# Patient Record
Sex: Female | Born: 1995 | Race: Black or African American | Hispanic: No | Marital: Single | Smoking: Former smoker
Health system: Southern US, Community
[De-identification: ages and names within clinical notes are randomized; demographics above are authoritative.]

## PROBLEM LIST (undated history)

## (undated) DIAGNOSIS — J45909 Unspecified asthma, uncomplicated: Secondary | ICD-10-CM

## (undated) DIAGNOSIS — F419 Anxiety disorder, unspecified: Secondary | ICD-10-CM

## (undated) DIAGNOSIS — F329 Major depressive disorder, single episode, unspecified: Secondary | ICD-10-CM

## (undated) DIAGNOSIS — F32A Depression, unspecified: Secondary | ICD-10-CM

## (undated) HISTORY — PX: TONSILLECTOMY: SUR1361

---

## 2017-05-23 ENCOUNTER — Emergency Department (HOSPITAL_BASED_OUTPATIENT_CLINIC_OR_DEPARTMENT_OTHER)
Admission: EM | Admit: 2017-05-23 | Discharge: 2017-05-23 | Disposition: A | Discharge status: Home / Self Care | Payer: Self-pay | Attending: Emergency Medicine | Admitting: Emergency Medicine

## 2017-05-23 ENCOUNTER — Other Ambulatory Visit: Payer: Self-pay

## 2017-05-23 ENCOUNTER — Emergency Department (HOSPITAL_BASED_OUTPATIENT_CLINIC_OR_DEPARTMENT_OTHER): Payer: Self-pay

## 2017-05-23 ENCOUNTER — Encounter (HOSPITAL_BASED_OUTPATIENT_CLINIC_OR_DEPARTMENT_OTHER): Payer: Self-pay

## 2017-05-23 DIAGNOSIS — R509 Fever, unspecified: Secondary | ICD-10-CM | POA: Insufficient documentation

## 2017-05-23 DIAGNOSIS — R1032 Left lower quadrant pain: Secondary | ICD-10-CM | POA: Insufficient documentation

## 2017-05-23 DIAGNOSIS — R112 Nausea with vomiting, unspecified: Secondary | ICD-10-CM | POA: Insufficient documentation

## 2017-05-23 DIAGNOSIS — J45909 Unspecified asthma, uncomplicated: Secondary | ICD-10-CM | POA: Insufficient documentation

## 2017-05-23 HISTORY — DX: Unspecified asthma, uncomplicated: J45.909

## 2017-05-23 LAB — COMPREHENSIVE METABOLIC PANEL
ALK PHOS: 51 U/L (ref 38–126)
ALT: 16 U/L (ref 14–54)
ANION GAP: 10 (ref 5–15)
AST: 24 U/L (ref 15–41)
Albumin: 3.9 g/dL (ref 3.5–5.0)
BILIRUBIN TOTAL: 0.8 mg/dL (ref 0.3–1.2)
BUN: 7 mg/dL (ref 6–20)
CALCIUM: 9 mg/dL (ref 8.9–10.3)
CO2: 22 mmol/L (ref 22–32)
CREATININE: 0.7 mg/dL (ref 0.44–1.00)
Chloride: 98 mmol/L — ABNORMAL LOW (ref 101–111)
Glucose, Bld: 106 mg/dL — ABNORMAL HIGH (ref 65–99)
Potassium: 3.8 mmol/L (ref 3.5–5.1)
Sodium: 130 mmol/L — ABNORMAL LOW (ref 135–145)
TOTAL PROTEIN: 7.7 g/dL (ref 6.5–8.1)

## 2017-05-23 LAB — CBC WITH DIFFERENTIAL/PLATELET
BASOS ABS: 0 10*3/uL (ref 0.0–0.1)
BASOS PCT: 0 %
EOS ABS: 0.1 10*3/uL (ref 0.0–0.7)
EOS PCT: 1 %
HCT: 43.8 % (ref 36.0–46.0)
Hemoglobin: 15.2 g/dL — ABNORMAL HIGH (ref 12.0–15.0)
LYMPHS ABS: 1.4 10*3/uL (ref 0.7–4.0)
Lymphocytes Relative: 13 %
MCH: 29.9 pg (ref 26.0–34.0)
MCHC: 34.7 g/dL (ref 30.0–36.0)
MCV: 86.2 fL (ref 78.0–100.0)
Monocytes Absolute: 1.1 10*3/uL — ABNORMAL HIGH (ref 0.1–1.0)
Monocytes Relative: 10 %
Neutro Abs: 8 10*3/uL — ABNORMAL HIGH (ref 1.7–7.7)
Neutrophils Relative %: 76 %
PLATELETS: 264 10*3/uL (ref 150–400)
RBC: 5.08 MIL/uL (ref 3.87–5.11)
RDW: 13.2 % (ref 11.5–15.5)
WBC: 10.6 10*3/uL — AB (ref 4.0–10.5)

## 2017-05-23 LAB — URINALYSIS, ROUTINE W REFLEX MICROSCOPIC
BILIRUBIN URINE: NEGATIVE
Glucose, UA: NEGATIVE mg/dL
KETONES UR: 15 mg/dL — AB
Nitrite: NEGATIVE
PH: 7.5 (ref 5.0–8.0)
Protein, ur: 30 mg/dL — AB
SPECIFIC GRAVITY, URINE: 1.015 (ref 1.005–1.030)

## 2017-05-23 LAB — URINALYSIS, MICROSCOPIC (REFLEX)

## 2017-05-23 LAB — MONONUCLEOSIS SCREEN: Mono Screen: NEGATIVE

## 2017-05-23 LAB — PREGNANCY, URINE: Preg Test, Ur: NEGATIVE

## 2017-05-23 LAB — I-STAT CG4 LACTIC ACID, ED: LACTIC ACID, VENOUS: 0.7 mmol/L (ref 0.5–1.9)

## 2017-05-23 MED ORDER — IBUPROFEN 800 MG PO TABS
800.0000 mg | ORAL_TABLET | Freq: Once | ORAL | Status: AC
  Administered 2017-05-23: 800 mg via ORAL
  Filled 2017-05-23: qty 1

## 2017-05-23 MED ORDER — SODIUM CHLORIDE 0.9 % IV BOLUS (SEPSIS)
1000.0000 mL | Freq: Once | INTRAVENOUS | Status: AC
  Administered 2017-05-23: 1000 mL via INTRAVENOUS

## 2017-05-23 MED ORDER — KETOROLAC TROMETHAMINE 30 MG/ML IJ SOLN
30.0000 mg | Freq: Once | INTRAMUSCULAR | Status: AC
  Administered 2017-05-23: 30 mg via INTRAVENOUS
  Filled 2017-05-23: qty 1

## 2017-05-23 MED ORDER — ONDANSETRON HCL 4 MG/2ML IJ SOLN
4.0000 mg | Freq: Once | INTRAMUSCULAR | Status: AC
  Administered 2017-05-23: 4 mg via INTRAVENOUS
  Filled 2017-05-23: qty 2

## 2017-05-23 MED ORDER — ONDANSETRON 4 MG PO TBDP: ORAL_TABLET | ORAL | 0 refills | Status: AC

## 2017-05-23 NOTE — ED Triage Notes (Signed)
C/o abd pain, fever, n/v x 2 days-NAD-steady gait

## 2017-05-23 NOTE — Discharge Instructions (Signed)
Ibuprofen or tylenol for pain/discomfort. Use zofran as directed for persistent vomiting.

## 2017-05-23 NOTE — ED Provider Notes (Signed)
[facility] EMERGENCY DEPARTMENT Provider Note   CSN: [id number] Arrival date & time: 05/23/17  2021     History   Chief Complaint Chief Complaint  Patient presents with  . Abdominal Pain    HPI Christine Stephens is a 22 y.o. female.  Patient states she developed fever and chills yesterday, with sudden onset on left lateral abdominal pain and nausea and vomiting.   The history is provided by the patient. No language interpreter was used.  Abdominal Pain   This is a new problem. The current episode started yesterday. The pain is located in the LLQ. The quality of the pain is colicky. The pain is moderate. Associated symptoms include fever, nausea and vomiting. Pertinent negatives include dysuria.    Past Medical History:  Diagnosis Date  . Asthma     There are no active problems to display for this patient.   Past Surgical History:  Procedure Laterality Date  . TONSILLECTOMY      OB History    No data available       Home Medications    Prior to Admission medications   Not on File    Family History No family history on file.  Social History Social History   Tobacco Use  . Smoking status: Never Smoker  . Smokeless tobacco: Never Used  Substance Use Topics  . Alcohol use: Yes    Comment: weekly  . Drug use: No     Allergies   Patient has no known allergies.   Review of Systems Review of Systems  Constitutional: Positive for fever.  Gastrointestinal: Positive for abdominal pain, nausea and vomiting.  Genitourinary: Negative for dysuria and vaginal discharge.  All other systems reviewed and are negative.    Physical Exam Updated Vital Signs BP 132/83 (BP Location: Left Arm)   Pulse (!) 133   Temp (!) 102.4 F (39.1 C) (Oral)   Resp 20   Ht 5\' 7"  (1.702 m)   Wt 60.5 kg (133 lb 6.1 oz)   SpO2 98%   BMI 20.89 kg/m   Physical Exam  Constitutional: She appears well-developed and well-nourished.  HENT:  Head: Atraumatic.    Eyes: Conjunctivae are normal.  Neck: Neck supple.  Cardiovascular: Regular rhythm.  Pulmonary/Chest: Effort normal and breath sounds normal.  Abdominal: Bowel sounds are normal. There is tenderness in the left lower quadrant. There is no rigidity, no guarding and no CVA tenderness.  Musculoskeletal: Normal range of motion.  Lymphadenopathy:    She has no cervical adenopathy.  Skin: Skin is warm and dry.  Psychiatric: She has a normal mood and affect.  Nursing note and vitals reviewed.    ED Treatments / Results  Labs (all labs ordered are listed, but only abnormal results are displayed) Labs Reviewed  URINALYSIS, ROUTINE W REFLEX MICROSCOPIC - Abnormal; Notable for the following components:      Result Value   APPearance HAZY (*)    Hgb urine dipstick SMALL (*)    Ketones, ur 15 (*)    Protein, ur 30 (*)    Leukocytes, UA SMALL (*)    All other components within normal limits  CBC WITH DIFFERENTIAL/PLATELET - Abnormal; Notable for the following components:   WBC 10.6 (*)    Hemoglobin 15.2 (*)    Neutro Abs 8.0 (*)    Monocytes Absolute 1.1 (*)    All other components within normal limits  URINALYSIS, MICROSCOPIC (REFLEX) - Abnormal; Notable for the following components:   Bacteria,  UA RARE (*)    Squamous Epithelial / LPF 6-30 (*)    All other components within normal limits  URINE CULTURE  CULTURE, BLOOD (ROUTINE X 2)  CULTURE, BLOOD (ROUTINE X 2)  PREGNANCY, URINE  COMPREHENSIVE METABOLIC PANEL  MONONUCLEOSIS SCREEN  I-STAT CG4 LACTIC ACID, ED    EKG  EKG Interpretation None       Radiology Dg Chest 2 View  Result Date: 05/23/2017 CLINICAL DATA:  Fever and left lateral lower chest pain.  Cough. EXAM: CHEST  2 VIEW COMPARISON:  None. FINDINGS: The heart size and mediastinal contours are within normal limits. Both lungs are clear. The visualized skeletal structures are unremarkable. Negative for a pneumothorax. IMPRESSION: No active cardiopulmonary disease.  Electronically Signed   By: Richarda Overlie M.D.   On: 05/23/2017 22:57    Procedures Procedures (including critical care time)  Medications Ordered in ED Medications  ibuprofen (ADVIL,MOTRIN) tablet 800 mg (800 mg Oral Given 05/23/17 2041)     Initial Impression / Assessment and Plan / ED Course  I have reviewed the triage vital signs and the nursing notes.  Pertinent labs & imaging results that were available during my care of the patient were reviewed by me and considered in my medical decision making (see chart for details).     Patient discussed with and seen by Dr. Fayrene Fearing.  Patient is nontoxic, nonseptic appearing, in no apparent distress.  Patient's pain and other symptoms adequately managed in emergency department.  Fluid bolus given.  Labs, imaging and vitals reviewed.  Patient does not meet the SIRS or Sepsis criteria.  On repeat exam patient does not have a surgical abdomen and there are no peritoneal signs.  No indication of appendicitis, bowel obstruction, bowel perforation, cholecystitis, diverticulitis, PID or ectopic pregnancy.  Patient discharged home with symptomatic treatment and given strict instructions for follow-up with their primary care physician.  I have also discussed reasons to return immediately to the ER.  Patient expresses understanding and agrees with plan.    Final Clinical Impressions(s) / ED Diagnoses   Final diagnoses:  Left lower quadrant pain    ED Discharge Orders        Ordered    ondansetron (ZOFRAN ODT) 4 MG disintegrating tablet     05/23/17 2330       Felicie Morn, NP 05/23/17 1610    Rolland Porter, MD 05/24/17 0000

## 2017-05-26 LAB — URINE CULTURE

## 2017-05-27 ENCOUNTER — Telehealth: Payer: Self-pay

## 2017-05-27 NOTE — Telephone Encounter (Signed)
No treatment needed for UC from ED 05/23/17 per Lysle Pearlachel Rumbarger Pharm D

## 2017-05-28 LAB — CULTURE, BLOOD (ROUTINE X 2)
CULTURE: NO GROWTH
Culture: NO GROWTH
Special Requests: ADEQUATE
Special Requests: ADEQUATE

## 2017-12-21 ENCOUNTER — Emergency Department (HOSPITAL_BASED_OUTPATIENT_CLINIC_OR_DEPARTMENT_OTHER)
Admission: EM | Admit: 2017-12-21 | Discharge: 2017-12-21 | Disposition: A | Discharge status: Home / Self Care | Payer: Self-pay | Attending: Emergency Medicine | Admitting: Emergency Medicine

## 2017-12-21 ENCOUNTER — Emergency Department (HOSPITAL_BASED_OUTPATIENT_CLINIC_OR_DEPARTMENT_OTHER): Payer: Self-pay

## 2017-12-21 ENCOUNTER — Other Ambulatory Visit: Payer: Self-pay

## 2017-12-21 ENCOUNTER — Encounter (HOSPITAL_BASED_OUTPATIENT_CLINIC_OR_DEPARTMENT_OTHER): Payer: Self-pay | Admitting: *Deleted

## 2017-12-21 DIAGNOSIS — Z79899 Other long term (current) drug therapy: Secondary | ICD-10-CM | POA: Insufficient documentation

## 2017-12-21 DIAGNOSIS — R0789 Other chest pain: Secondary | ICD-10-CM | POA: Insufficient documentation

## 2017-12-21 DIAGNOSIS — J45909 Unspecified asthma, uncomplicated: Secondary | ICD-10-CM | POA: Insufficient documentation

## 2017-12-21 DIAGNOSIS — F17200 Nicotine dependence, unspecified, uncomplicated: Secondary | ICD-10-CM | POA: Insufficient documentation

## 2017-12-21 HISTORY — DX: Depression, unspecified: F32.A

## 2017-12-21 HISTORY — DX: Anxiety disorder, unspecified: F41.9

## 2017-12-21 HISTORY — DX: Major depressive disorder, single episode, unspecified: F32.9

## 2017-12-21 NOTE — ED Notes (Signed)
Pt approaches nurse first desk with quick steady gait stating "I think my lung collapsed." nurse first spo2 spot check is 100%. Pt encouraged to slow her resp, informed that her sat is 100%.

## 2017-12-21 NOTE — ED Notes (Signed)
Pt. In no distress and is alert and oriented with no c/o back pain or L arm pain.

## 2017-12-21 NOTE — ED Triage Notes (Signed)
Pt reports feeling sob x this am off and on. Denies any pain or other c/o. resp are easy and reg, pt is crying, lungs are cta a+ p x 2. resp therapist assessed pt during triage.

## 2017-12-21 NOTE — ED Provider Notes (Signed)
[facility] EMERGENCY DEPARTMENT Provider Note   CSN: [id number] Arrival date & time: 12/21/17  1526     History   Chief Complaint Chief Complaint  Patient presents with  . Shortness of Breath    HPI Christine Stephens is a 22 y.o. female.  78 yo F with a chief complaint of chest pain.  This started about 3 hours ago while she was lying in bed.  She had the pain is sharp and shooting to the left side of her chest.  Worse with deep breathing or lying back flat.  When she is short of breath as well.  Denies history of PE or DVT.  She does smoke.  She denies hypertension hyperlipidemia or diabetes.  Denies family history of MI.  Denies recent surgery denies hemoptysis denies cough congestion fever denies unilateral lower extremity edema denies estrogen use denies prolonged travel.  The history is provided by the patient.  Shortness of Breath  Associated symptoms include chest pain. Pertinent negatives include no fever, no headaches, no rhinorrhea, no wheezing and no vomiting. Associated medical issues do not include PE, past MI or DVT.  Chest Pain   This is a new problem. The current episode started 3 to 5 hours ago. The problem occurs constantly. The problem has not changed since onset.The pain is associated with movement and breathing. The pain is present in the lateral region. The pain is at a severity of 9/10. The pain is severe. The quality of the pain is described as brief. The pain radiates to the left shoulder. Duration of episode(s) is 3 hours. The symptoms are aggravated by certain positions. Associated symptoms include shortness of breath. Pertinent negatives include no dizziness, no fever, no headaches, no nausea, no palpitations and no vomiting. She has tried nothing for the symptoms. The treatment provided no relief. Risk factors include smoking/tobacco exposure.  Pertinent negatives for past medical history include no diabetes, no DVT, no hyperlipidemia, no hypertension,  no MI and no PE.  Pertinent negatives for family medical history include: no early MI.    Past Medical History:  Diagnosis Date  . Anxiety   . Asthma   . Depression     There are no active problems to display for this patient.   Past Surgical History:  Procedure Laterality Date  . TONSILLECTOMY       OB History   None      Home Medications    Prior to Admission medications   Medication Sig Start Date End Date Taking? Authorizing Provider  beclomethasone (QVAR) 40 MCG/ACT inhaler Inhale into the lungs 2 (two) times daily.   Yes [provider]  ondansetron (ZOFRAN ODT) 4 MG disintegrating tablet 4mg  ODT q6 hours prn nausea/vomit 05/23/17   Felicie Morn, NP    Family History History reviewed. No pertinent family history.  Social History Social History   Tobacco Use  . Smoking status: Current Every Day Smoker  . Smokeless tobacco: Never Used  Substance Use Topics  . Alcohol use: Yes    Comment: weekly  . Drug use: No     Allergies   Patient has no known allergies.   Review of Systems Review of Systems  Constitutional: Negative for chills and fever.  HENT: Negative for congestion and rhinorrhea.   Eyes: Negative for redness and visual disturbance.  Respiratory: Positive for shortness of breath. Negative for wheezing.   Cardiovascular: Positive for chest pain. Negative for palpitations.  Gastrointestinal: Negative for nausea and vomiting.  Genitourinary:  Negative for dysuria and urgency.  Musculoskeletal: Negative for arthralgias and myalgias.  Skin: Negative for pallor and wound.  Neurological: Negative for dizziness and headaches.     Physical Exam Updated Vital Signs BP 113/65   Pulse 84   Temp 98.7 F (37.1 C) (Oral)   Resp 13   LMP 12/19/2017   SpO2 100%   Physical Exam  Constitutional: She is oriented to person, place, and time. She appears well-developed and well-nourished. No distress.  HENT:  Head: Normocephalic and  atraumatic.  Eyes: Pupils are equal, round, and reactive to light. EOM are normal.  Neck: Normal range of motion. Neck supple.  Cardiovascular: Normal rate and regular rhythm. Exam reveals no gallop and no friction rub.  No murmur heard. Pulmonary/Chest: Effort normal. She has no wheezes. She has no rales. She exhibits tenderness.  Pain is reproduced with palpation of the left anterior chest wall about the lateral clavicular line about ribs 3 through 5.  Abdominal: Soft. She exhibits no distension. There is no tenderness.  Musculoskeletal: She exhibits no edema or tenderness.  Neurological: She is alert and oriented to person, place, and time.  Skin: Skin is warm and dry. She is not diaphoretic.  Psychiatric: She has a normal mood and affect. Her behavior is normal.  Nursing note and vitals reviewed.    ED Treatments / Results  Labs (all labs ordered are listed, but only abnormal results are displayed) Labs Reviewed - No data to display  EKG EKG Interpretation  Date/Time:  Thursday December 21 2017 16:45:49 EDT Ventricular Rate:  92 PR Interval:    QRS Duration: 90 QT Interval:  382 QTC Calculation: 473 R Axis:   88 Text Interpretation:  Sinus rhythm Baseline wander in lead(s) V4 V5 V6 No old tracing to compare Confirmed by Melene Plan [phone]) on 12/21/2017 5:12:17 PM   Radiology Dg Chest 2 View  Result Date: 12/21/2017 CLINICAL DATA:  22 year old female with left chest pain radiating to the upper back with intermittent shortness of breath. EXAM: CHEST - 2 VIEW COMPARISON:  Chest radiographs 05/23/2017. FINDINGS: Lung volumes and mediastinal contours remain normal. Visualized tracheal air column is within normal limits. Both lungs are clear. No pneumothorax or pleural effusion. No osseous abnormality identified aside from mild spinal scoliotic curvature. Negative visible bowel gas pattern. IMPRESSION: Negative.  No cardiopulmonary abnormality. Electronically Signed   By: Odessa Fleming M.D.    On: 12/21/2017 16:53    Procedures Procedures (including critical care time)  Medications Ordered in ED Medications - No data to display   Initial Impression / Assessment and Plan / ED Course  I have reviewed the triage vital signs and the nursing notes.  Pertinent labs & imaging results that were available during my care of the patient were reviewed by me and considered in my medical decision making (see chart for details).     22 yo F with a chief complaint of shortness of breath.  This is been going on for the past 3 hours.  She is complaining of chest pain that sharp and pinpoint to the left side.  There is reproduced with palpation of the chest wall.  Will obtain an EKG and a chest x-ray.  I feel this is completely atypical of ACS as well as PE.  Chest x-ray negative for focal Ultracet or pneumothorax.  Discharge home.  PCP follow-up.  5:21 PM:  I have discussed the diagnosis/risks/treatment options with the patient and family and believe the pt to be  eligible for discharge home to follow-up with PCP. We also discussed returning to the ED immediately if new or worsening sx occur. We discussed the sx which are most concerning (e.g., sudden worsening pain, fever, inability to tolerate by mouth) that necessitate immediate return. Medications administered to the patient during their visit and any new prescriptions provided to the patient are listed below.  Medications given during this visit Medications - No data to display   The patient appears reasonably screen and/or stabilized for discharge and I doubt any other medical condition or other [facility] requiring further screening, evaluation, or treatment in the ED at this time prior to discharge.    Final Clinical Impressions(s) / ED Diagnoses   Final diagnoses:  Atypical chest pain    ED Discharge Orders    None       Melene PlanFloyd, Lurdes Haltiwanger, DO 12/21/17 1721

## 2019-03-21 ENCOUNTER — Other Ambulatory Visit: Payer: Self-pay | Admitting: Cardiology

## 2019-03-21 DIAGNOSIS — Z20822 Contact with and (suspected) exposure to covid-19: Secondary | ICD-10-CM

## 2019-03-25 LAB — NOVEL CORONAVIRUS, NAA: SARS-CoV-2, NAA: NOT DETECTED

## 2019-05-15 IMAGING — DX DG CHEST 2V
2 series · 2 of 2 positions shown · non-contrast
Comparison: None.

CLINICAL DATA: Fever and left lateral lower chest pain.  Cough.

EXAM:
CHEST  2 VIEW

[chest pa]
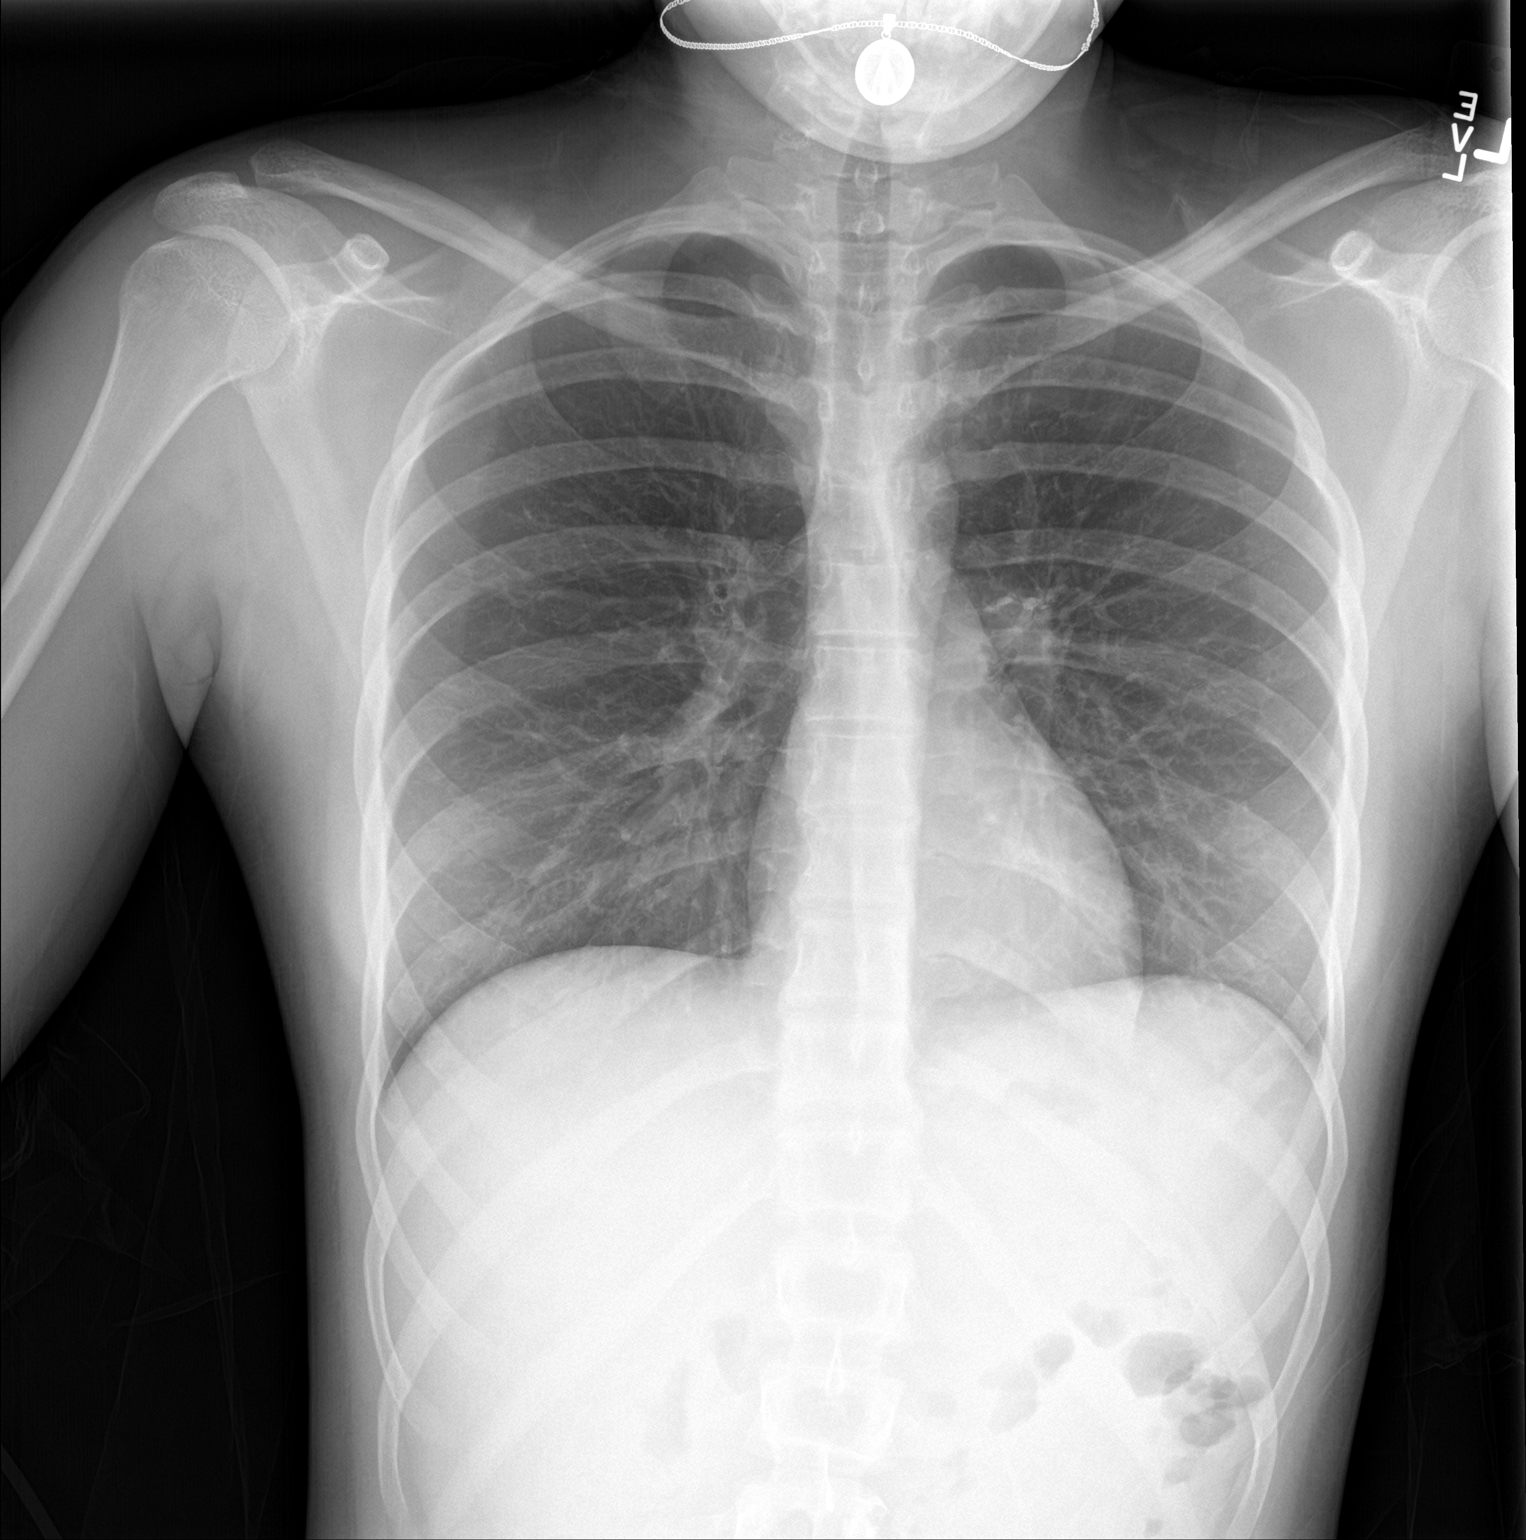

[chest lat]
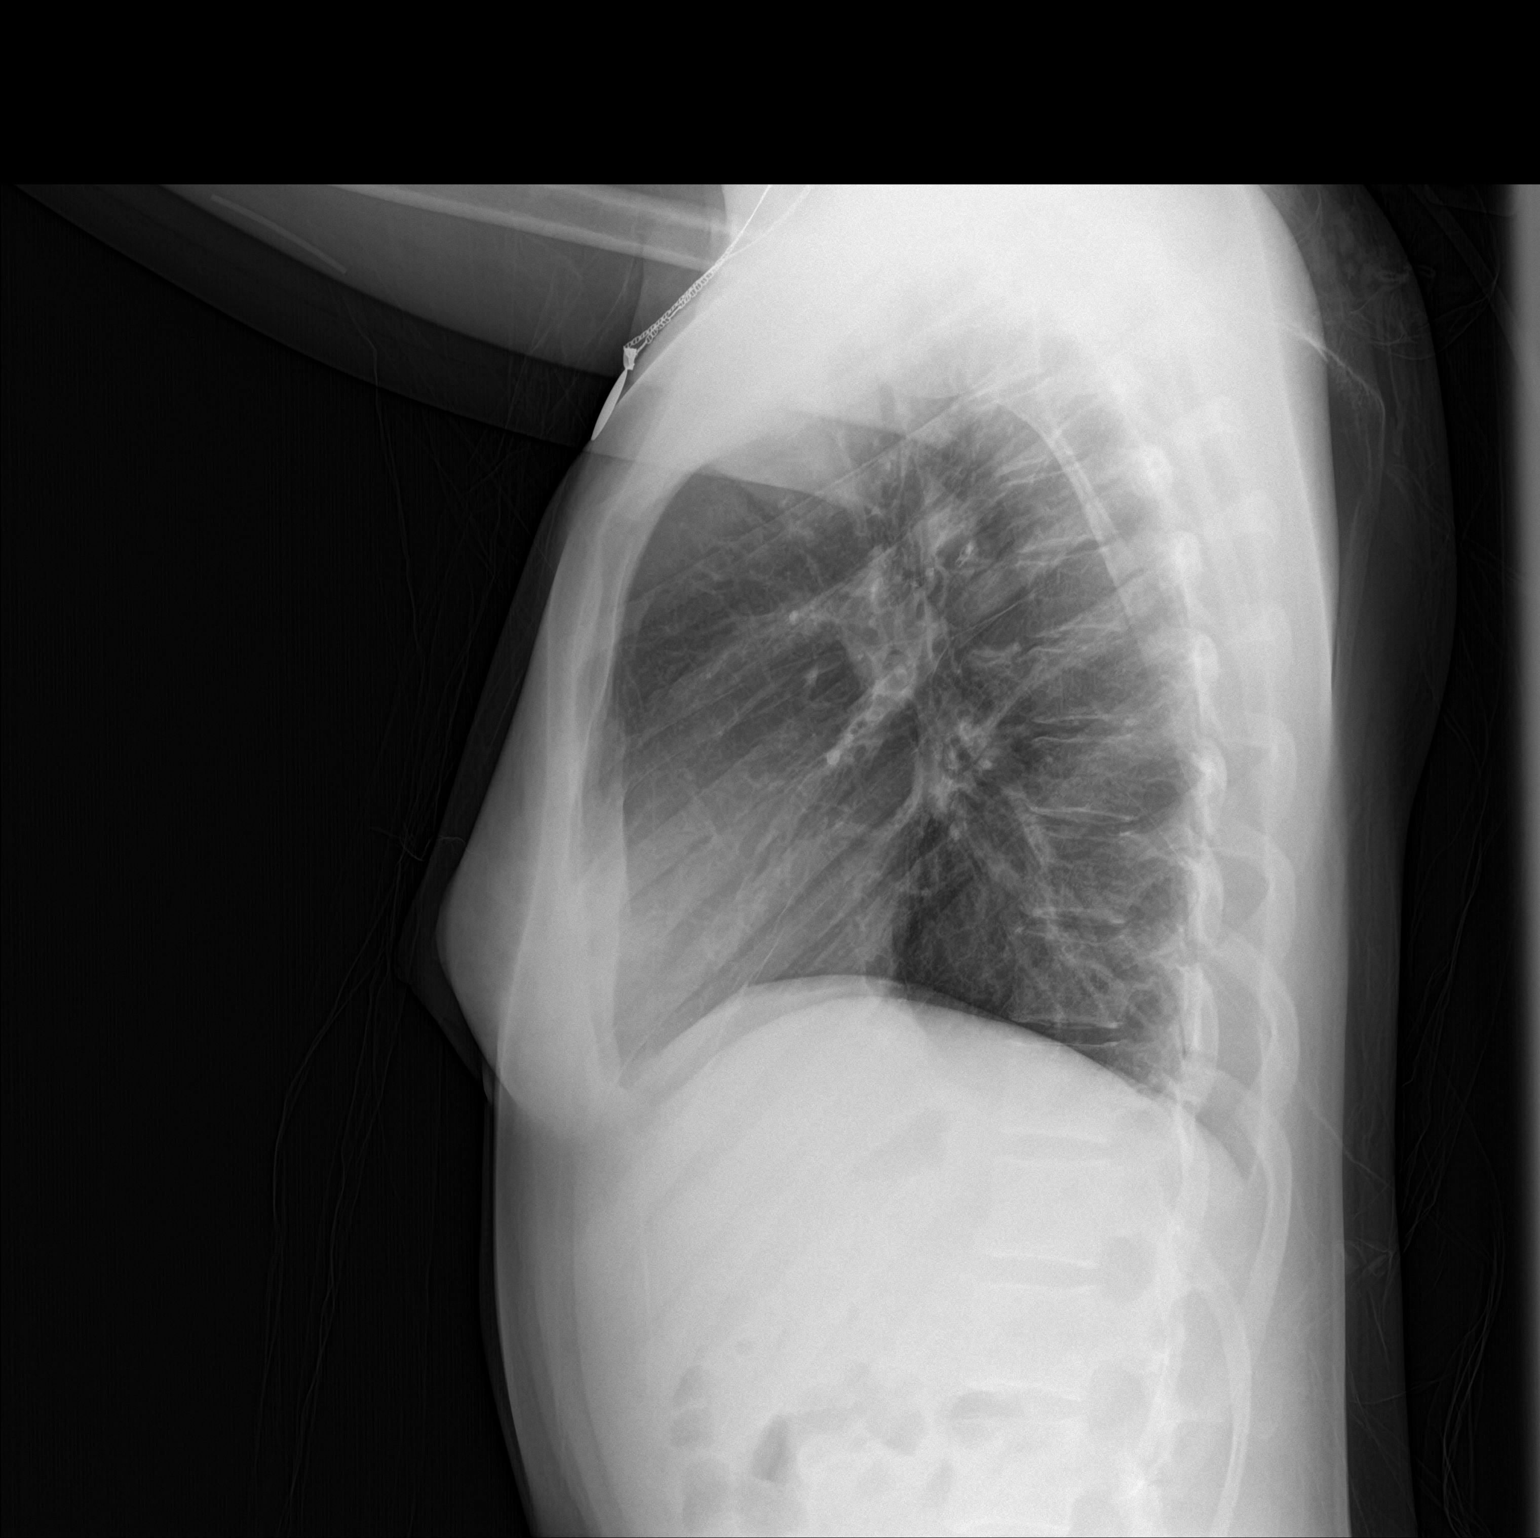

[2 of 2 positions shown; findings below may reference images not displayed]

FINDINGS: The heart size and mediastinal contours are within normal limits.
Both lungs are clear. The visualized skeletal structures are
unremarkable. Negative for a pneumothorax.
IMPRESSION: No active cardiopulmonary disease.

## 2022-02-21 ENCOUNTER — Observation Stay (HOSPITAL_COMMUNITY)
Admission: EM | Admit: 2022-02-21 | Discharge: 2022-02-22 | Disposition: A | Discharge status: Home / Self Care | Payer: BLUE CROSS/BLUE SHIELD | Attending: Surgery | Admitting: Surgery

## 2022-02-21 ENCOUNTER — Emergency Department (HOSPITAL_COMMUNITY): Payer: BLUE CROSS/BLUE SHIELD

## 2022-02-21 ENCOUNTER — Encounter (HOSPITAL_COMMUNITY): Payer: Self-pay

## 2022-02-21 ENCOUNTER — Other Ambulatory Visit: Payer: Self-pay

## 2022-02-21 DIAGNOSIS — K353 Acute appendicitis with localized peritonitis, without perforation or gangrene: Principal | ICD-10-CM

## 2022-02-21 DIAGNOSIS — R1031 Right lower quadrant pain: Secondary | ICD-10-CM | POA: Diagnosis present

## 2022-02-21 DIAGNOSIS — K3580 Unspecified acute appendicitis: Secondary | ICD-10-CM | POA: Diagnosis present

## 2022-02-21 DIAGNOSIS — Z87891 Personal history of nicotine dependence: Secondary | ICD-10-CM | POA: Insufficient documentation

## 2022-02-21 DIAGNOSIS — N83201 Unspecified ovarian cyst, right side: Secondary | ICD-10-CM | POA: Diagnosis present

## 2022-02-21 DIAGNOSIS — J45909 Unspecified asthma, uncomplicated: Secondary | ICD-10-CM | POA: Diagnosis not present

## 2022-02-21 LAB — CBC WITH DIFFERENTIAL/PLATELET
Abs Immature Granulocytes: 0.02 10*3/uL (ref 0.00–0.07)
Basophils Absolute: 0.1 10*3/uL (ref 0.0–0.1)
Basophils Relative: 1 %
Eosinophils Absolute: 0.3 10*3/uL (ref 0.0–0.5)
Eosinophils Relative: 4 %
HCT: 43.3 % (ref 36.0–46.0)
Hemoglobin: 15.1 g/dL — ABNORMAL HIGH (ref 12.0–15.0)
Immature Granulocytes: 0 %
Lymphocytes Relative: 32 %
Lymphs Abs: 2.3 10*3/uL (ref 0.7–4.0)
MCH: 30.6 pg (ref 26.0–34.0)
MCHC: 34.9 g/dL (ref 30.0–36.0)
MCV: 87.8 fL (ref 80.0–100.0)
Monocytes Absolute: 0.5 10*3/uL (ref 0.1–1.0)
Monocytes Relative: 6 %
Neutro Abs: 4.2 10*3/uL (ref 1.7–7.7)
Neutrophils Relative %: 57 %
Platelets: 342 10*3/uL (ref 150–400)
RBC: 4.93 MIL/uL (ref 3.87–5.11)
RDW: 12.2 % (ref 11.5–15.5)
WBC: 7.3 10*3/uL (ref 4.0–10.5)
nRBC: 0 % (ref 0.0–0.2)

## 2022-02-21 LAB — COMPREHENSIVE METABOLIC PANEL
ALT: 16 U/L (ref 0–44)
AST: 18 U/L (ref 15–41)
Albumin: 4.3 g/dL (ref 3.5–5.0)
Alkaline Phosphatase: 48 U/L (ref 38–126)
Anion gap: 7 (ref 5–15)
BUN: 10 mg/dL (ref 6–20)
CO2: 23 mmol/L (ref 22–32)
Calcium: 9.2 mg/dL (ref 8.9–10.3)
Chloride: 106 mmol/L (ref 98–111)
Creatinine, Ser: 0.66 mg/dL (ref 0.44–1.00)
GFR, Estimated: >60 mL/min (ref >60)
Glucose, Bld: 94 mg/dL (ref 70–99)
Potassium: 3.5 mmol/L (ref 3.5–5.1)
Sodium: 136 mmol/L (ref 135–145)
Total Bilirubin: 0.6 mg/dL (ref 0.3–1.2)
Total Protein: 7.4 g/dL (ref 6.5–8.1)

## 2022-02-21 LAB — I-STAT BETA HCG BLOOD, ED (MC, WL, AP ONLY): I-stat hCG, quantitative: <5 m[IU]/mL (ref <5)

## 2022-02-21 LAB — LIPASE, BLOOD: Lipase: 34 U/L (ref 11–51)

## 2022-02-21 MED ORDER — LACTATED RINGERS IV BOLUS
1000.0000 mL | Freq: Once | INTRAVENOUS | Status: AC
  Administered 2022-02-21: 1000 mL via INTRAVENOUS

## 2022-02-21 MED ORDER — ONDANSETRON HCL 4 MG/2ML IJ SOLN
4.0000 mg | Freq: Once | INTRAMUSCULAR | Status: AC
  Administered 2022-02-21: 4 mg via INTRAVENOUS
  Filled 2022-02-21: qty 2

## 2022-02-21 MED ORDER — HYDROMORPHONE HCL 1 MG/ML IJ SOLN
1.0000 mg | Freq: Once | INTRAMUSCULAR | Status: AC
  Administered 2022-02-21: 1 mg via INTRAVENOUS
  Filled 2022-02-21: qty 1

## 2022-02-21 MED ORDER — IOHEXOL 300 MG/ML  SOLN
100.0000 mL | Freq: Once | INTRAMUSCULAR | Status: AC | PRN
  Administered 2022-02-21: 100 mL via INTRAVENOUS

## 2022-02-21 MED ORDER — IBUPROFEN 200 MG PO TABS: 600.0000 mg | ORAL_TABLET | Freq: Once | ORAL | Status: DC

## 2022-02-21 NOTE — ED Provider Notes (Signed)
  [facility] Emergency Department Provider Note MRN:  [id number]  Arrival date & time: 02/21/22     Chief Complaint   Abdominal Pain, Nausea, and Bloated   History of Present Illness   Christine Stephens is a 26 y.o. year-old female presents to the ED with chief complaint of RLQ abdominal pain for the past 2 days.  Has been gradually worsening.  Worse with palpation.  Has had associated nausea and bloating sensation.  History provided by patient.   Review of Systems  Pertinent positive and negative review of systems noted in HPI.    Physical Exam   Vitals:   02/21/22 1732 02/21/22 1846  BP: 123/80 128/83  Pulse: (!) 105 83  Resp: 18 16  Temp: 98.7 F (37.1 C) 98.8 F (37.1 C)  SpO2: 100% 97%    CONSTITUTIONAL:  uncomfortable-appearing, NAD NEURO:  Alert and oriented x 3, CN 3-12 grossly intact EYES:  eyes equal and reactive ENT/NECK:  Supple, no stridor  CARDIO:  normal rate, appears well-perfused  PULM:  No respiratory distress, CTAB GI/GU:  non-distended, RLQ abdominal TTP MSK/SPINE:  No gross deformities, no edema, moves all extremities  SKIN:  no rash, atraumatic   *Additional and/or pertinent findings included in MDM below  Diagnostic and Interventional Summary     Labs Reviewed  CBC WITH DIFFERENTIAL/PLATELET - Abnormal; Notable for the following components:      Result Value   Hemoglobin 15.1 (*)    All other components within normal limits  COMPREHENSIVE METABOLIC PANEL  LIPASE, BLOOD  URINALYSIS, ROUTINE W REFLEX MICROSCOPIC  I-STAT BETA HCG BLOOD, ED ([facility], [facility], [facility] ONLY)    CT Abdomen Pelvis W Contrast  Final Result      Medications  ibuprofen (ADVIL) tablet 600 mg (has no administration in time range)  HYDROmorphone (DILAUDID) injection 1 mg (has no administration in time range)  ondansetron (ZOFRAN) injection 4 mg (has no administration in time range)  lactated ringers bolus 1,000 mL (has no administration in time range)   iohexol (OMNIPAQUE) 300 MG/ML solution 100 mL (100 mLs Intravenous Contrast Given 02/21/22 1948)     Procedures  /  Critical Care Procedures  ED Course and Medical Decision Making  I have reviewed the triage vital signs, the nursing notes, and pertinent available records from the EMR.  Social Determinants Affecting Complexity of Care: Patient has no clinically significant social determinants affecting this chief complaint..   ED Course:    Medical Decision Making Risk Prescription drug management. Decision regarding hospitalization.     Consultants: I discussed the case with Dr. Barry Dienes, who is appreciated for admitting.   Treatment and Plan: Patient's exam and diagnostic results are concerning for appendicitis.  Feel that patient will need admission to the hospital for further treatment and evaluation.    Final Clinical Impressions(s) / ED Diagnoses     ICD-10-CM   1. Acute appendicitis with localized peritonitis without perforation, unspecified whether abscess present, unspecified whether gangrene present  K35.30       ED Discharge Orders     None         Discharge Instructions Discussed with and Provided to Patient:   Discharge Instructions   None      Montine Circle, PA-C 02/21/22 2214    Sherwood Gambler, MD 02/21/22 2306

## 2022-02-21 NOTE — ED Notes (Signed)
Labs/IV for CT 

## 2022-02-21 NOTE — ED Triage Notes (Signed)
Patient c/o RLQ  abdominal pain x 3-4 days ago. Patient states it started mid abdomen and is not RLQ. Patient also c/o nausea, but V/D

## 2022-02-21 NOTE — H&P (Addendum)
Christine Stephens is an 26 y.o. female.   Chief Complaint: abdominal pain HPI:  Pt is a 26 yo F with around 3-4 days of worsening abdominal pain, nausea, vomiting.  No fevers.  She had worsening pain and came to the ED. Originally there was thought that this might be an ovarian cyst as her mother has had multiple ovarian cyst.  However, the pain worsened and she developed nausea and vomiting as well as decreased appetite.  CT scan shows an ovarian cyst and acute appendicitis.  She denies dizziness.  She has been able to keep down liquids, but not solids.  She has been completely not hungry for several days.  She has had discomfort with urination and bowel movements, but not pain.  Her pain is worse when she moves and when she lays down flat.  It is better when she sits up and leans over forward.  She does report pain with intercourse that also has been worsening.  This is not a new problem.  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  She does not generally have to do heavy lifting.  She does work out religiously.  She is accompanied by her fianc.  Past Medical History:  Diagnosis Date   Anxiety    Asthma    Depression     Past Surgical History:  Procedure Laterality Date   TONSILLECTOMY      No family history on file. Social History:  reports that she has quit smoking. Her smoking use included cigarettes. She has never used smokeless tobacco. She reports current alcohol use. She reports that she does not use drugs.  Allergies: No Known Allergies  Meds:  Claritin  Results for orders placed or performed during the hospital encounter of 02/21/22 (from the past 48 hour(s))  CBC with Differential     Status: Abnormal   Collection Time: 02/21/22  6:02 PM  Result Value Ref Range   WBC 7.3 4.0 - 10.5 K/uL   RBC 4.93 3.87 - 5.11 MIL/uL   Hemoglobin 15.1 (H) 12.0 - 15.0 g/dL   HCT 43.3 36.0 - 46.0 %   MCV 87.8 80.0 - 100.0 fL   MCH 30.6 26.0 - 34.0 pg   MCHC 34.9 30.0  - 36.0 g/dL   RDW 12.2 11.5 - 15.5 %   Platelets 342 150 - 400 K/uL   nRBC 0.0 0.0 - 0.2 %   Neutrophils Relative % 57 %   Neutro Abs 4.2 1.7 - 7.7 K/uL   Lymphocytes Relative 32 %   Lymphs Abs 2.3 0.7 - 4.0 K/uL   Monocytes Relative 6 %   Monocytes Absolute 0.5 0.1 - 1.0 K/uL   Eosinophils Relative 4 %   Eosinophils Absolute 0.3 0.0 - 0.5 K/uL   Basophils Relative 1 %   Basophils Absolute 0.1 0.0 - 0.1 K/uL   Immature Granulocytes 0 %   Abs Immature Granulocytes 0.02 0.00 - 0.07 K/uL    Comment: Performed at [facility], [place] [address] [place], [place] [address]  Comprehensive metabolic panel     Status: None   Collection Time: 02/21/22  6:02 PM  Result Value Ref Range   Sodium 136 135 - 145 mmol/L   Potassium 3.5 3.5 - 5.1 mmol/L   Chloride 106 98 - 111 mmol/L   CO2 23 22 - 32 mmol/L   Glucose, Bld 94 70 - 99 mg/dL    Comment: Glucose reference range applies only to samples taken  after fasting for at least 8 hours.   BUN 10 6 - 20 mg/dL   Creatinine, Ser 0.66 0.44 - 1.00 mg/dL   Calcium 9.2 8.9 - 10.3 mg/dL   Total Protein 7.4 6.5 - 8.1 g/dL   Albumin 4.3 3.5 - 5.0 g/dL   AST 18 15 - 41 U/L   ALT 16 0 - 44 U/L   Alkaline Phosphatase 48 38 - 126 U/L   Total Bilirubin 0.6 0.3 - 1.2 mg/dL   GFR, Estimated >60 >60 mL/min    Comment: (NOTE) Calculated using the CKD-EPI Creatinine Equation (2021)    Anion gap 7 5 - 15    Comment: Performed at [facility], [place] [address], [place], [place] [address]  Lipase, blood     Status: None   Collection Time: 02/21/22  6:02 PM  Result Value Ref Range   Lipase 34 11 - 51 U/L    Comment: Performed at [facility], [place] [address] [place], [place] [address]  I-Stat Beta hCG blood, ED ([facility], [facility], [facility] only)     Status: None   Collection Time: 02/21/22  6:12 PM  Result Value Ref Range   I-stat hCG, quantitative <5.0 <5 mIU/mL   Comment 3            Comment:   GEST. AGE      CONC.   (mIU/mL)   <=1 WEEK        5 - 50     2 WEEKS       50 - 500     3 WEEKS       100 - 10,000     4 WEEKS     1,000 - 30,000        FEMALE AND NON-PREGNANT FEMALE:     LESS THAN 5 mIU/mL    CT Abdomen Pelvis W Contrast  Result Date: 02/21/2022 CLINICAL DATA:  Right lower quadrant abdominal pain. EXAM: CT ABDOMEN AND PELVIS WITH CONTRAST TECHNIQUE: Multidetector CT imaging of the abdomen and pelvis was performed using the standard protocol following bolus administration of intravenous contrast. RADIATION DOSE REDUCTION: This exam was performed according to the departmental dose-optimization program which includes automated exposure control, adjustment of the mA and/or kV according to patient size and/or use of iterative reconstruction technique. CONTRAST:  160mL OMNIPAQUE IOHEXOL 300 MG/ML  SOLN COMPARISON:  None Available. FINDINGS: Lower chest: The visualized lung bases are clear. No intra-abdominal free air or free fluid. Hepatobiliary: No focal liver abnormality is seen. No gallstones, gallbladder wall thickening, or biliary dilatation. Pancreas: Unremarkable. No pancreatic ductal dilatation or surrounding inflammatory changes. Spleen: Normal in size without focal abnormality. Adrenals/Urinary Tract: The adrenal glands are unremarkable. Mild pelviectasis of the upper pole collecting system of the right kidney versus less likely a parapelvic cyst. The left kidney is unremarkable. The visualized ureters and urinary bladder appear unremarkable. Stomach/Bowel: There is moderate stool throughout the colon. There is no bowel obstruction. There is a small stone in the proximal lumen of the appendix. The appendix is enlarged and thickened distal to the stone measuring up to 15 mm in thickness. There is mild peritendinous seal stranding. The appendix is located in the right hemipelvis anteriorly to the right of the bladder and medial to the right external iliac vessels. No drainable fluid collection/abscess. No  evidence of perforation. Vascular/Lymphatic: The abdominal aorta and IVC unremarkable. No portal venous gas. There is no adenopathy. Reproductive: The uterus is anteverted and grossly  unremarkable. A tampon is noted in the vagina. The left ovary is unremarkable. There is a 5 cm right ovarian cyst. No follow-up imaging recommended. Note: This recommendation does not apply to premenarchal patients and to those with increased risk (genetic, family history, elevated tumor markers or other high-risk factors) of ovarian cancer. Reference: JACR 2020 Feb; 17(2):248-254 Other: None Musculoskeletal: No acute or significant osseous findings. IMPRESSION: 1. Acute appendicitis. No abscess or evidence of perforation. 2. A 5 cm right adnexal cyst.  No follow-up. Electronically Signed   By: Anner Crete M.D.   On: 02/21/2022 20:21    Review of Systems  All other systems reviewed and are negative.    Blood pressure 128/83, pulse 83, temperature 98.8 F (37.1 C), temperature source Oral, resp. rate 16, height 5' 6.5" (1.689 m), weight 61.7 kg, last menstrual period 02/21/2022, SpO2 97 %. Physical Exam Vitals reviewed.  Constitutional:      General: She is in acute distress (looks uncomfortable).     Appearance: She is well-developed.  HENT:     Head: Normocephalic and atraumatic.  Eyes:     General: No scleral icterus.    Extraocular Movements: Extraocular movements intact.     Pupils: Pupils are equal, round, and reactive to light.  Cardiovascular:     Rate and Rhythm: Normal rate and regular rhythm.     Heart sounds: Normal heart sounds. No murmur heard.    No gallop.  Pulmonary:     Effort: Pulmonary effort is normal. No respiratory distress.     Breath sounds: Normal breath sounds. No stridor. No wheezing or rhonchi.  Abdominal:     General: Abdomen is flat. Bowel sounds are decreased. There is no distension. There are no signs of injury.     Palpations: Abdomen is soft. There is no shifting  dullness, fluid wave, hepatomegaly, splenomegaly or mass.     Tenderness: There is abdominal tenderness in the right lower quadrant. There is guarding. There is no rebound. Positive signs include Murphy's sign and Rovsing's sign.  Skin:    General: Skin is warm and dry.     Capillary Refill: Capillary refill takes 2 to 3 seconds.     Coloration: Skin is not cyanotic, jaundiced, mottled or pale.     Findings: No erythema or rash.  Neurological:     General: No focal deficit present.     Mental Status: She is alert and oriented to person, place, and time.  Psychiatric:        Mood and Affect: Mood normal. Mood is not anxious or depressed.        Behavior: Behavior normal.     Assessment/Plan Acute appendicitis Right ovarian cyst  IV fluids IV antibiotics NPO after midnight. Laparoscopic appendectomy tomorrow.    Discussed surgery with the patient and fiance. Will discuss ovarian cyst with surgical team tomorrow.  CT scan pictures reviewed with patient.   Appendectomy was described to the patient.  The incisions and surgical technique were explained.  The patient was advised that some of the hair on the abdomen would be clipped, and that a foley catheter might be placed.  I advised the patient of the risks of surgery including, but not limited to, bleeding, infection, damage to other structures, risk of an open operation, risk of abscess, and risk of blood clot.  The recovery was also described to the patient.  She was advised that he will have lifting restrictions for 2 weeks.  Stark Klein, MD 02/21/2022, 9:58 PM

## 2022-02-21 NOTE — ED Provider Triage Note (Signed)
Emergency Medicine Provider Triage Evaluation Note  Christine Stephens , a 26 y.o. female  was evaluated in triage.  Pt complains of abdominal pain.  Pain started 2 days ago.  Located about the umbilicus and radiates to the right lower quadrant of her abdomen.  Endorses nausea but no vomiting or diarrhea.  Denies fever.  Denies hematuria, bloody stools, hematemesis.  Patient also noted that her abdomen was significantly more "bloated" this morning.  States that she looked like she was "pregnant" all of a sudden.  Review of Systems  Positive: See above Negative: See above  Physical Exam  BP 123/80   Pulse (!) 105   Temp 98.7 F (37.1 C) (Oral)   Resp 18   Ht 5' 6.5" (1.689 m)   Wt 61.7 kg   LMP 02/21/2022   SpO2 100%   BMI 21.62 kg/m  Gen:   Awake, no distress   Resp:  Normal effort  MSK:   Moves extremities without difficulty  Other:  Distended abdomen  Medical Decision Making  Medically screening exam initiated at 5:51 PM.  Appropriate orders placed.  Christine Stephens was informed that the remainder of the evaluation will be completed by another provider, this initial triage assessment does not replace that evaluation, and the importance of remaining in the ED until their evaluation is complete.  Work up initiated   Christine Pho, PA-C 02/21/22 1753

## 2022-02-22 ENCOUNTER — Encounter (HOSPITAL_COMMUNITY)
Admission: EM | Disposition: A | Discharge status: Home / Self Care | Payer: Self-pay | Source: Home / Self Care | Attending: Emergency Medicine

## 2022-02-22 ENCOUNTER — Encounter (HOSPITAL_COMMUNITY): Payer: Self-pay

## 2022-02-22 ENCOUNTER — Observation Stay (HOSPITAL_COMMUNITY): Payer: BLUE CROSS/BLUE SHIELD | Admitting: Certified Registered Nurse Anesthetist

## 2022-02-22 ENCOUNTER — Other Ambulatory Visit: Payer: Self-pay

## 2022-02-22 HISTORY — PX: LAPAROSCOPIC APPENDECTOMY: SHX408

## 2022-02-22 LAB — CBC
HCT: 37.5 % (ref 36.0–46.0)
Hemoglobin: 12.8 g/dL (ref 12.0–15.0)
MCH: 30.3 pg (ref 26.0–34.0)
MCHC: 34.1 g/dL (ref 30.0–36.0)
MCV: 88.9 fL (ref 80.0–100.0)
Platelets: 292 10*3/uL (ref 150–400)
RBC: 4.22 MIL/uL (ref 3.87–5.11)
RDW: 12.3 % (ref 11.5–15.5)
WBC: 5.9 10*3/uL (ref 4.0–10.5)
nRBC: 0 % (ref 0.0–0.2)

## 2022-02-22 LAB — HIV ANTIBODY (ROUTINE TESTING W REFLEX): HIV Screen 4th Generation wRfx: NONREACTIVE

## 2022-02-22 LAB — CREATININE, SERUM
Creatinine, Ser: 0.55 mg/dL (ref 0.44–1.00)
GFR, Estimated: >60 mL/min (ref >60)

## 2022-02-22 SURGERY — APPENDECTOMY, LAPAROSCOPIC
Anesthesia: General

## 2022-02-22 MED ORDER — 0.9 % SODIUM CHLORIDE (POUR BTL) OPTIME
TOPICAL | Status: DC | PRN
  Administered 2022-02-22: 1000 mL

## 2022-02-22 MED ORDER — LORATADINE 10 MG PO TABS: 10.0000 mg | ORAL_TABLET | Freq: Every day | ORAL | Status: DC

## 2022-02-22 MED ORDER — SODIUM CHLORIDE 0.9 % IV SOLN
2.0000 g | INTRAVENOUS | Status: DC
  Administered 2022-02-22: 2 g via INTRAVENOUS
  Filled 2022-02-22: qty 20

## 2022-02-22 MED ORDER — LIDOCAINE HCL (PF) 2 % IJ SOLN
INTRAMUSCULAR | Status: AC
  Filled 2022-02-22: qty 5

## 2022-02-22 MED ORDER — DOCUSATE SODIUM 100 MG PO CAPS
100.0000 mg | ORAL_CAPSULE | Freq: Two times a day (BID) | ORAL | Status: DC
  Administered 2022-02-22: 100 mg via ORAL
  Filled 2022-02-22: qty 1

## 2022-02-22 MED ORDER — PROCHLORPERAZINE MALEATE 10 MG PO TABS: 10.0000 mg | ORAL_TABLET | Freq: Four times a day (QID) | ORAL | Status: DC | PRN

## 2022-02-22 MED ORDER — PROPOFOL 10 MG/ML IV BOLUS
INTRAVENOUS | Status: AC
  Filled 2022-02-22: qty 20

## 2022-02-22 MED ORDER — DEXMEDETOMIDINE HCL IN NACL 80 MCG/20ML IV SOLN
INTRAVENOUS | Status: DC | PRN
  Administered 2022-02-22: 4 ug via BUCCAL
  Administered 2022-02-22: 8 ug via BUCCAL
  Administered 2022-02-22 (×2): 4 ug via BUCCAL

## 2022-02-22 MED ORDER — OXYCODONE HCL 5 MG/5ML PO SOLN: 5.0000 mg | Freq: Once | ORAL | Status: AC | PRN

## 2022-02-22 MED ORDER — KETOROLAC TROMETHAMINE 30 MG/ML IJ SOLN
30.0000 mg | Freq: Four times a day (QID) | INTRAMUSCULAR | Status: DC
  Administered 2022-02-22 (×2): 30 mg via INTRAVENOUS
  Filled 2022-02-22 (×2): qty 1

## 2022-02-22 MED ORDER — DEXMEDETOMIDINE HCL IN NACL 80 MCG/20ML IV SOLN
INTRAVENOUS | Status: AC
  Filled 2022-02-22: qty 20

## 2022-02-22 MED ORDER — OXYCODONE HCL 5 MG PO TABS
ORAL_TABLET | ORAL | Status: AC
  Filled 2022-02-22: qty 1

## 2022-02-22 MED ORDER — FENTANYL CITRATE (PF) 100 MCG/2ML IJ SOLN
INTRAMUSCULAR | Status: DC | PRN
  Administered 2022-02-22 (×3): 50 ug via INTRAVENOUS

## 2022-02-22 MED ORDER — METHOCARBAMOL 500 MG PO TABS: 500.0000 mg | ORAL_TABLET | Freq: Three times a day (TID) | ORAL | Status: DC | PRN

## 2022-02-22 MED ORDER — ONDANSETRON HCL 4 MG/2ML IJ SOLN
INTRAMUSCULAR | Status: DC | PRN
  Administered 2022-02-22: 4 mg via INTRAVENOUS

## 2022-02-22 MED ORDER — PROCHLORPERAZINE EDISYLATE 10 MG/2ML IJ SOLN: 5.0000 mg | Freq: Four times a day (QID) | INTRAMUSCULAR | Status: DC | PRN

## 2022-02-22 MED ORDER — MIDAZOLAM HCL 2 MG/2ML IJ SOLN
INTRAMUSCULAR | Status: AC
  Filled 2022-02-22: qty 2

## 2022-02-22 MED ORDER — ACETAMINOPHEN 500 MG PO TABS
1000.0000 mg | ORAL_TABLET | Freq: Four times a day (QID) | ORAL | Status: DC
  Administered 2022-02-22 (×2): 1000 mg via ORAL
  Filled 2022-02-22 (×2): qty 2

## 2022-02-22 MED ORDER — SCOPOLAMINE 1 MG/3DAYS TD PT72
1.0000 | MEDICATED_PATCH | Freq: Once | TRANSDERMAL | Status: DC
  Administered 2022-02-22: 1.5 mg via TRANSDERMAL
  Filled 2022-02-22: qty 1

## 2022-02-22 MED ORDER — DEXAMETHASONE SODIUM PHOSPHATE 4 MG/ML IJ SOLN
INTRAMUSCULAR | Status: DC | PRN
  Administered 2022-02-22: 10 mg via INTRAVENOUS

## 2022-02-22 MED ORDER — FENTANYL CITRATE PF 50 MCG/ML IJ SOSY
PREFILLED_SYRINGE | INTRAMUSCULAR | Status: AC
  Filled 2022-02-22: qty 3

## 2022-02-22 MED ORDER — OXYCODONE HCL 5 MG PO TABS
5.0000 mg | ORAL_TABLET | Freq: Once | ORAL | Status: AC | PRN
  Administered 2022-02-22: 5 mg via ORAL

## 2022-02-22 MED ORDER — ONDANSETRON HCL 4 MG/2ML IJ SOLN
INTRAMUSCULAR | Status: AC
  Filled 2022-02-22: qty 2

## 2022-02-22 MED ORDER — ONDANSETRON 4 MG PO TBDP: 4.0000 mg | ORAL_TABLET | Freq: Four times a day (QID) | ORAL | Status: DC | PRN

## 2022-02-22 MED ORDER — SIMETHICONE 80 MG PO CHEW: 40.0000 mg | CHEWABLE_TABLET | Freq: Four times a day (QID) | ORAL | Status: DC | PRN

## 2022-02-22 MED ORDER — OXYCODONE HCL 5 MG PO TABS: 5.0000 mg | ORAL_TABLET | ORAL | Status: DC | PRN

## 2022-02-22 MED ORDER — AMISULPRIDE (ANTIEMETIC) 5 MG/2ML IV SOLN
10.0000 mg | Freq: Once | INTRAVENOUS | Status: AC | PRN
  Administered 2022-02-22: 10 mg via INTRAVENOUS

## 2022-02-22 MED ORDER — PROPOFOL 10 MG/ML IV BOLUS
INTRAVENOUS | Status: DC | PRN
  Administered 2022-02-22: 30 mg via INTRAVENOUS
  Administered 2022-02-22: 120 mg via INTRAVENOUS

## 2022-02-22 MED ORDER — BUPIVACAINE-EPINEPHRINE (PF) 0.25% -1:200000 IJ SOLN
INTRAMUSCULAR | Status: AC
  Filled 2022-02-22: qty 30

## 2022-02-22 MED ORDER — HYDROMORPHONE HCL 1 MG/ML IJ SOLN: 0.5000 mg | INTRAMUSCULAR | Status: DC | PRN

## 2022-02-22 MED ORDER — SUGAMMADEX SODIUM 200 MG/2ML IV SOLN
INTRAVENOUS | Status: DC | PRN
  Administered 2022-02-22: 150 mg via INTRAVENOUS

## 2022-02-22 MED ORDER — LIDOCAINE 2% (20 MG/ML) 5 ML SYRINGE
INTRAMUSCULAR | Status: DC | PRN
  Administered 2022-02-22: 100 mg via INTRAVENOUS

## 2022-02-22 MED ORDER — BUPIVACAINE-EPINEPHRINE (PF) 0.25% -1:200000 IJ SOLN
INTRAMUSCULAR | Status: DC | PRN
  Administered 2022-02-22: 30 mL

## 2022-02-22 MED ORDER — AMISULPRIDE (ANTIEMETIC) 5 MG/2ML IV SOLN
INTRAVENOUS | Status: AC
  Filled 2022-02-22: qty 4

## 2022-02-22 MED ORDER — PROPOFOL 500 MG/50ML IV EMUL
INTRAVENOUS | Status: AC
  Filled 2022-02-22: qty 50

## 2022-02-22 MED ORDER — FENTANYL CITRATE PF 50 MCG/ML IJ SOSY
25.0000 ug | PREFILLED_SYRINGE | INTRAMUSCULAR | Status: DC | PRN
  Administered 2022-02-22 (×2): 50 ug via INTRAVENOUS

## 2022-02-22 MED ORDER — PROPOFOL 500 MG/50ML IV EMUL
INTRAVENOUS | Status: DC | PRN
  Administered 2022-02-22: 50 ug/kg/min via INTRAVENOUS

## 2022-02-22 MED ORDER — MELATONIN 3 MG PO TABS: 3.0000 mg | ORAL_TABLET | Freq: Every evening | ORAL | Status: DC | PRN

## 2022-02-22 MED ORDER — SENNOSIDES-DOCUSATE SODIUM 8.6-50 MG PO TABS: 1.0000 | ORAL_TABLET | Freq: Every evening | ORAL | Status: DC | PRN

## 2022-02-22 MED ORDER — DIPHENHYDRAMINE HCL 12.5 MG/5ML PO ELIX: 12.5000 mg | ORAL_SOLUTION | Freq: Four times a day (QID) | ORAL | Status: DC | PRN

## 2022-02-22 MED ORDER — KCL-LACTATED RINGERS-D5W 20 MEQ/L IV SOLN
INTRAVENOUS | Status: DC
  Filled 2022-02-22: qty 1000

## 2022-02-22 MED ORDER — KETOROLAC TROMETHAMINE 30 MG/ML IJ SOLN: 30.0000 mg | Freq: Four times a day (QID) | INTRAMUSCULAR | Status: DC | PRN

## 2022-02-22 MED ORDER — METHOCARBAMOL 1000 MG/10ML IJ SOLN: 500.0000 mg | Freq: Three times a day (TID) | INTRAVENOUS | Status: DC | PRN

## 2022-02-22 MED ORDER — ROCURONIUM BROMIDE 10 MG/ML (PF) SYRINGE
PREFILLED_SYRINGE | INTRAVENOUS | Status: AC
  Filled 2022-02-22: qty 10

## 2022-02-22 MED ORDER — METRONIDAZOLE 500 MG/100ML IV SOLN
500.0000 mg | Freq: Two times a day (BID) | INTRAVENOUS | Status: DC
  Administered 2022-02-22: 500 mg via INTRAVENOUS
  Filled 2022-02-22: qty 100

## 2022-02-22 MED ORDER — ACETAMINOPHEN 500 MG PO TABS: 1000.0000 mg | ORAL_TABLET | Freq: Four times a day (QID) | ORAL | 0 refills | Status: AC | PRN

## 2022-02-22 MED ORDER — DIPHENHYDRAMINE HCL 50 MG/ML IJ SOLN: 12.5000 mg | Freq: Four times a day (QID) | INTRAMUSCULAR | Status: DC | PRN

## 2022-02-22 MED ORDER — OXYCODONE HCL 5 MG PO TABS: 5.0000 mg | ORAL_TABLET | ORAL | 0 refills | Status: AC | PRN

## 2022-02-22 MED ORDER — FENTANYL CITRATE (PF) 100 MCG/2ML IJ SOLN
INTRAMUSCULAR | Status: AC
  Filled 2022-02-22: qty 2

## 2022-02-22 MED ORDER — MIDAZOLAM HCL 5 MG/5ML IJ SOLN
INTRAMUSCULAR | Status: DC | PRN
  Administered 2022-02-22: 2 mg via INTRAVENOUS

## 2022-02-22 MED ORDER — ROCURONIUM BROMIDE 10 MG/ML (PF) SYRINGE
PREFILLED_SYRINGE | INTRAVENOUS | Status: DC | PRN
  Administered 2022-02-22: 50 mg via INTRAVENOUS

## 2022-02-22 MED ORDER — LACTATED RINGERS IV SOLN: INTRAVENOUS | Status: DC

## 2022-02-22 MED ORDER — ENOXAPARIN SODIUM 40 MG/0.4ML IJ SOSY
40.0000 mg | PREFILLED_SYRINGE | Freq: Every day | INTRAMUSCULAR | Status: DC
  Administered 2022-02-22: 40 mg via SUBCUTANEOUS
  Filled 2022-02-22: qty 0.4

## 2022-02-22 MED ORDER — ONDANSETRON HCL 4 MG/2ML IJ SOLN: 4.0000 mg | Freq: Four times a day (QID) | INTRAMUSCULAR | Status: DC | PRN

## 2022-02-22 MED ORDER — DEXAMETHASONE SODIUM PHOSPHATE 10 MG/ML IJ SOLN
INTRAMUSCULAR | Status: AC
  Filled 2022-02-22: qty 1

## 2022-02-22 SURGICAL SUPPLY — 45 items
APPLIER CLIP 5 13 M/L LIGAMAX5 (MISCELLANEOUS)
APPLIER CLIP ROT 10 11.4 M/L (STAPLE)
BAG COUNTER SPONGE SURGICOUNT (BAG) ×1 IMPLANT
CABLE HIGH FREQUENCY MONO STRZ (ELECTRODE) ×1 IMPLANT
CHLORAPREP W/TINT 26 (MISCELLANEOUS) ×1 IMPLANT
CLIP APPLIE 5 13 M/L LIGAMAX5 (MISCELLANEOUS) IMPLANT
CLIP APPLIE ROT 10 11.4 M/L (STAPLE) IMPLANT
CORD SCALPEL HARMONIC (MISCELLANEOUS) ×1 IMPLANT
CUTTER FLEX LINEAR 45M (STAPLE) ×1 IMPLANT
DERMABOND ADVANCED .7 DNX12 (GAUZE/BANDAGES/DRESSINGS) ×1 IMPLANT
DRAIN CHANNEL 19F RND (DRAIN) IMPLANT
ELECT REM PT RETURN 15FT ADLT (MISCELLANEOUS) ×1 IMPLANT
ENDOLOOP SUT PDS II  0 18 (SUTURE)
ENDOLOOP SUT PDS II 0 18 (SUTURE) IMPLANT
EVACUATOR SILICONE 100CC (DRAIN) IMPLANT
GLOVE BIO SURGEON STRL SZ7.5 (GLOVE) ×1 IMPLANT
GLOVE INDICATOR 8.0 STRL GRN (GLOVE) ×1 IMPLANT
GOWN STRL REUS W/ TWL XL LVL3 (GOWN DISPOSABLE) ×2 IMPLANT
GOWN STRL REUS W/TWL XL LVL3 (GOWN DISPOSABLE) ×2
GRASPER SUT TROCAR 14GX15 (MISCELLANEOUS) ×1 IMPLANT
HARMONIC SCALPEL CORD (MISCELLANEOUS)
IRRIG SUCT STRYKERFLOW 2 WTIP (MISCELLANEOUS) ×2
IRRIGATION SUCT STRKRFLW 2 WTP (MISCELLANEOUS) ×2 IMPLANT
KIT BASIN OR (CUSTOM PROCEDURE TRAY) ×1 IMPLANT
KIT TURNOVER KIT A (KITS) IMPLANT
NDL INSUFFLATION 14GA 120MM (NEEDLE) ×1 IMPLANT
NEEDLE INSUFFLATION 14GA 120MM (NEEDLE) ×1 IMPLANT
PENCIL SMOKE EVACUATOR (MISCELLANEOUS) ×1 IMPLANT
RELOAD 45 VASCULAR/THIN (ENDOMECHANICALS) ×1 IMPLANT
RELOAD STAPLE 45 2.5 WHT GRN (ENDOMECHANICALS) IMPLANT
RELOAD STAPLE 45 3.5 BLU ETS (ENDOMECHANICALS) IMPLANT
RELOAD STAPLE TA45 3.5 REG BLU (ENDOMECHANICALS) IMPLANT
SCISSORS LAP 5X35 DISP (ENDOMECHANICALS) IMPLANT
SET TUBE SMOKE EVAC HIGH FLOW (TUBING) ×1 IMPLANT
SHEARS HARMONIC ACE PLUS 36CM (ENDOMECHANICALS) ×1 IMPLANT
SLEEVE ADV FIXATION 5X100MM (TROCAR) ×1 IMPLANT
SPIKE FLUID TRANSFER (MISCELLANEOUS) ×1 IMPLANT
SUT ETHILON 3 0 PS 1 (SUTURE) IMPLANT
SUT MNCRL AB 4-0 PS2 18 (SUTURE) ×1 IMPLANT
TOWEL OR 17X26 10 PK STRL BLUE (TOWEL DISPOSABLE) IMPLANT
TRAY FOLEY MTR SLVR 14FR STAT (SET/KITS/TRAYS/PACK) IMPLANT
TRAY FOLEY MTR SLVR 16FR STAT (SET/KITS/TRAYS/PACK) IMPLANT
TRAY LAPAROSCOPIC (CUSTOM PROCEDURE TRAY) ×1 IMPLANT
TROCAR ADV FIXATION 12X100MM (TROCAR) ×1 IMPLANT
TROCAR ADV FIXATION 5X100MM (TROCAR) ×1 IMPLANT

## 2022-02-22 NOTE — Transfer of Care (Signed)
Immediate Anesthesia Transfer of Care Note  Patient: Christine Stephens  Procedure(s) Performed: APPENDECTOMY LAPAROSCOPIC  Patient Location: PACU  Anesthesia Type:General  Level of Consciousness: awake, alert , oriented and patient cooperative  Airway & Oxygen Therapy: Patient Spontanous Breathing and Patient connected to face mask  Post-op Assessment: Report given to RN and Post -op Vital signs reviewed and stable  Post vital signs: Reviewed and stable  Last Vitals:  Vitals Value Taken Time  BP 126/63 02/22/22 1015  Temp 36.3 C 02/22/22 1015  Pulse 101 02/22/22 1016  Resp 15 02/22/22 1016  SpO2 99 % 02/22/22 1016  Vitals shown include unvalidated device data.  Last Pain:  Vitals:   02/22/22 0821  TempSrc:   PainSc: 6       Patients Stated Pain Goal: 2 (09/32/67 1245)  Complications: No notable events documented.

## 2022-02-22 NOTE — Anesthesia Procedure Notes (Signed)
Procedure Name: Intubation Date/Time: 02/22/2022 9:25 AM  Performed by: Claudia Desanctis, CRNAPre-anesthesia Checklist: Patient identified, Emergency Drugs available, Suction available and Patient being monitored Patient Re-evaluated:Patient Re-evaluated prior to induction Oxygen Delivery Method: Circle system utilized Preoxygenation: Pre-oxygenation with 100% oxygen Induction Type: IV induction Ventilation: Mask ventilation without difficulty Laryngoscope Size: Miller and 3 Grade View: Grade I Tube type: Oral Tube size: 7.0 mm Number of attempts: 1 Airway Equipment and Method: Stylet Placement Confirmation: ETT inserted through vocal cords under direct vision, positive ETCO2 and breath sounds checked- equal and bilateral Secured at: 21 cm Tube secured with: Tape Dental Injury: Teeth and Oropharynx as per pre-operative assessment

## 2022-02-22 NOTE — Discharge Instructions (Signed)
[facility] ° °Please arrive at least 30 min before your appointment to complete your check in paperwork.  If you are unable to arrive 30 min prior to your appointment time we may have to cancel or reschedule you. °LAPAROSCOPIC SURGERY: POST OP INSTRUCTIONS °Always review your discharge instruction sheet given to you by the facility where your surgery was performed. °IF YOU HAVE DISABILITY OR FAMILY LEAVE FORMS, YOU MUST BRING THEM TO THE OFFICE FOR PROCESSING.   °DO NOT GIVE THEM TO YOUR DOCTOR. ° °PAIN CONTROL ° °First take acetaminophen (Tylenol) AND/or ibuprofen (Advil) to control your pain after surgery.  Follow directions on package.  Taking acetaminophen (Tylenol) and/or ibuprofen (Advil) regularly after surgery will help to control your pain and lower the amount of prescription pain medication you may need.  You should not take more than 4,000 mg (4 grams) of acetaminophen (Tylenol) in 24 hours.  You should not take ibuprofen (Advil), aleve, motrin, naprosyn or other NSAIDS if you have a history of stomach ulcers or chronic kidney disease.  °A prescription for pain medication may be given to you upon discharge.  Take your pain medication as prescribed, if you still have uncontrolled pain after taking acetaminophen (Tylenol) or ibuprofen (Advil). °Use ice packs to help control pain. °If you need a refill on your pain medication, please contact your pharmacy.  They will contact our office to request authorization. Prescriptions will not be filled after 5pm or on week-ends. ° °HOME MEDICATIONS °Take your usually prescribed medications unless otherwise directed. ° °DIET °You should follow a light diet the first few days after arrival home.  Be sure to include lots of fluids daily. Avoid fatty, fried foods.  ° °CONSTIPATION °It is common to experience some constipation after surgery and if you are taking pain medication.  Increasing fluid intake and taking a stool softener (such as Colace)  will usually help or prevent this problem from occurring.  A mild laxative (Milk of Magnesia or Miralax) should be taken according to package instructions if there are no bowel movements after 48 hours. ° °WOUND/INCISION CARE °Most patients will experience some swelling and bruising in the area of the incisions.  Ice packs will help.  Swelling and bruising can take several days to resolve.  °Unless discharge instructions indicate otherwise, follow guidelines below  °STERI-STRIPS - you may remove your outer bandages 48 hours after surgery, and you may shower at that time.  You have steri-strips (small skin tapes) in place directly over the incision.  These strips should be left on the skin for 7-10 days.   °DERMABOND/SKIN GLUE - you may shower in 24 hours.  The glue will flake off over the next 2-3 weeks. °Any sutures or staples will be removed at the office during your follow-up visit. ° °ACTIVITIES °You may resume regular (light) daily activities beginning the next day--such as daily self-care, walking, climbing stairs--gradually increasing activities as tolerated.  You may have sexual intercourse when it is comfortable.  Refrain from any heavy lifting or straining until approved by your doctor. °You may drive when you are no longer taking prescription pain medication, you can comfortably wear a seatbelt, and you can safely maneuver your car and apply brakes. ° °FOLLOW-UP °You should see your doctor in the office for a follow-up appointment approximately 2-3 weeks after your surgery.  You should have been given your post-op/follow-up appointment when your surgery was scheduled.  If you did not receive a post-op/follow-up appointment, make sure   that you call for this appointment within a day or two after you arrive home to insure a convenient appointment time. ° ° °WHEN TO CALL YOUR DOCTOR: °Fever over 101.0 °Inability to urinate °Continued bleeding from incision. °Increased pain, redness, or drainage from the  incision. °Increasing abdominal pain ° °The clinic staff is available to answer your questions during regular business hours.  Please don’t hesitate to call and ask to speak to one of the nurses for clinical concerns.  If you have a medical emergency, go to the nearest emergency room or call 911.  A surgeon from [facility] is always on call at the hospital. °[address], [address], [place], [place]  [address] ? [address] [address], [place], [place]   [address] °[phone] ? [phone] ? FAX [phone] ° ° ° ° °Managing Your Pain After Surgery Without Opioids ° ° ° °Thank you for participating in our program to help patients manage their pain after surgery without opioids. This is part of our effort to provide you with the best care possible, without exposing you or your family to the risk that opioids pose. ° °What pain can I expect after surgery? °You can expect to have some pain after surgery. This is normal. The pain is typically worse the day after surgery, and quickly begins to get better. °Many studies have found that many patients are able to manage their pain after surgery with Over-the-Counter (OTC) medications such as Tylenol and Motrin. If you have a condition that does not allow you to take Tylenol or Motrin, notify your surgical team. ° °How will I manage my pain? °The best strategy for controlling your pain after surgery is around the clock pain control with Tylenol (acetaminophen) and Motrin (ibuprofen or Advil). Alternating these medications with each other allows you to maximize your pain control. In addition to Tylenol and Motrin, you can use heating pads or ice packs on your incisions to help reduce your pain. ° °How will I alternate your regular strength over-the-counter pain medication? °You will take a dose of pain medication every three hours. °Start by taking 650 mg of Tylenol (2 pills of 325 mg) °3 hours later take 600 mg of Motrin (3 pills of 200 mg) °3 hours after  taking the Motrin take 650 mg of Tylenol °3 hours after that take 600 mg of Motrin. ° ° °- 1 - ° °See example - if your first dose of Tylenol is at 12:00 PM ° ° °12:00 PM Tylenol 650 mg (2 pills of 325 mg)  °3:00 PM Motrin 600 mg (3 pills of 200 mg)  °6:00 PM Tylenol 650 mg (2 pills of 325 mg)  °9:00 PM Motrin 600 mg (3 pills of 200 mg)  °Continue alternating every 3 hours  ° °We recommend that you follow this schedule around-the-clock for at least 3 days after surgery, or until you feel that it is no longer needed. Use the table on the last page of this handout to keep track of the medications you are taking. °Important: °Do not take more than 3000mg of Tylenol or 3200mg of Motrin in a 24-hour period. °Do not take ibuprofen/Motrin if you have a history of bleeding stomach ulcers, severe kidney disease, &/or actively taking a blood thinner ° °What if I still have pain? °If you have pain that is not controlled with the over-the-counter pain medications (Tylenol and Motrin or Advil) you might have what we call “breakthrough” pain. You will receive a prescription   for a small amount of an opioid pain medication such as Oxycodone, Tramadol, or Tylenol with Codeine. Use these opioid pills in the first 24 hours after surgery if you have breakthrough pain. Do not take more than 1 pill every 4-6 hours. ° °If you still have uncontrolled pain after using all opioid pills, don't hesitate to call our staff using the number provided. We will help make sure you are managing your pain in the best way possible, and if necessary, we can provide a prescription for additional pain medication. ° ° °Day 1   ° °Time  °Name of Medication Number of pills taken  °Amount of Acetaminophen  °Pain Level  ° °Comments  °AM PM       °AM PM       °AM PM       °AM PM       °AM PM       °AM PM       °AM PM       °AM PM       °Total Daily amount of Acetaminophen °Do not take more than  3,000 mg per day    ° ° °Day 2   ° °Time  °Name of Medication  Number of pills °taken  °Amount of Acetaminophen  °Pain Level  ° °Comments  °AM PM       °AM PM       °AM PM       °AM PM       °AM PM       °AM PM       °AM PM       °AM PM       °Total Daily amount of Acetaminophen °Do not take more than  3,000 mg per day    ° ° °Day 3   ° °Time  °Name of Medication Number of pills taken  °Amount of Acetaminophen  °Pain Level  ° °Comments  °AM PM       °AM PM       °AM PM       °AM PM       ° ° ° °AM PM       °AM PM       °AM PM       °AM PM       °Total Daily amount of Acetaminophen °Do not take more than  3,000 mg per day    ° ° °Day 4   ° °Time  °Name of Medication Number of pills taken  °Amount of Acetaminophen  °Pain Level  ° °Comments  °AM PM       °AM PM       °AM PM       °AM PM       °AM PM       °AM PM       °AM PM       °AM PM       °Total Daily amount of Acetaminophen °Do not take more than  3,000 mg per day    ° ° °Day 5   ° °Time  °Name of Medication Number °of pills taken  °Amount of Acetaminophen  °Pain Level  ° °Comments  °AM PM       °AM PM       °AM PM       °AM PM       °AM PM       °AM   PM       °AM PM       °AM PM       °Total Daily amount of Acetaminophen °Do not take more than  3,000 mg per day    ° ° ° °Day 6   ° °Time  °Name of Medication Number of pills °taken  °Amount of Acetaminophen  °Pain Level  °Comments  °AM PM       °AM PM       °AM PM       °AM PM       °AM PM       °AM PM       °AM PM       °AM PM       °Total Daily amount of Acetaminophen °Do not take more than  3,000 mg per day    ° ° °Day 7   ° °Time  °Name of Medication Number of pills taken  °Amount of Acetaminophen  °Pain Level  ° °Comments  °AM PM       °AM PM       °AM PM       °AM PM       °AM PM       °AM PM       °AM PM       °AM PM       °Total Daily amount of Acetaminophen °Do not take more than  3,000 mg per day    ° ° ° ° °For additional information about how and where to safely dispose of unused opioid °medications - https://www.morepowerfulnc.org ° °Disclaimer: This document  contains information and/or instructional materials adapted from [facility] for the typical patient with your condition. It does not replace medical advice from your health care provider because your experience may differ from that of the °typical patient. Talk to your health care provider if you have any questions about this °document, your condition or your treatment plan. °Adapted from [facility] ° °

## 2022-02-22 NOTE — Discharge Summary (Signed)
    Patient ID: Christine Stephens [id number] 09/21/1995 26 y.o.  Admit date: 02/21/2022 Discharge date: 02/22/2022  Admitting Diagnosis: Acute appendicitis  Discharge Diagnosis Patient Active Problem List   Diagnosis Date Noted   Acute appendicitis 02/21/2022   Right ovarian cyst 02/21/2022  S/p lap appy  Consultants none  Reason for Admission: Christine Stephens is an 26 y.o. female who is here for abdominal pain for a few days.  Located right lower quadrant.   Procedures Lap appy, Dr. Thermon Leyland 10/24  Hospital Course:  The patient was admitted and underwent a laparoscopic appendectomy.  The patient tolerated the procedure well.  She was discharged from PACU with no issues.   Allergies as of 02/22/2022   No Known Allergies      Medication List     TAKE these medications    acetaminophen 500 MG tablet Commonly known as: TYLENOL Take 2 tablets (1,000 mg total) by mouth every 6 (six) hours as needed.   albuterol 108 (90 Base) MCG/ACT inhaler Commonly known as: VENTOLIN HFA Inhale 2 puffs into the lungs 4 (four) times daily as needed.   loratadine 10 MG tablet Commonly known as: CLARITIN Take 10 mg by mouth daily.   ondansetron 4 MG disintegrating tablet Commonly known as: Zofran ODT 4mg  ODT q6 hours prn nausea/vomit   oxyCODONE 5 MG immediate release tablet Commonly known as: Oxy IR/ROXICODONE Take 1 tablet (5 mg total) by mouth every 4 (four) hours as needed for moderate pain.          Follow-up Information     Maczis, Carlena Hurl, PA-C Follow up in 3 week(s).   Specialty: General Surgery Why: Office will call you with a follow up appointment, If you don't hear from the office, please call, Arrive 30 minutes prior to your appointment time, Please bring your insurance card and photo ID Contact information: [place] [address] [phone]                 Signed: Saverio Danker,  [facility] Surgery 02/22/2022, 1:26 PM Please see Amion for pager number during day hours 7:00am-4:30pm, 7-11:30am on Weekends

## 2022-02-22 NOTE — Anesthesia Preprocedure Evaluation (Addendum)
Anesthesia Evaluation  Patient identified by MRN, date of birth, ID band Patient awake    Reviewed: Allergy & Precautions, NPO status , Patient's Chart, lab work & pertinent test results  History of Anesthesia Complications Negative for: history of anesthetic complications  Airway Mallampati: II  TM Distance: >3 FB Neck ROM: Full    Dental no notable dental hx.    Pulmonary asthma , Patient abstained from smoking., former smoker,    Pulmonary exam normal        Cardiovascular negative cardio ROS Normal cardiovascular exam     Neuro/Psych Anxiety Depression negative neurological ROS     GI/Hepatic Neg liver ROS, Acute appendicitis   Endo/Other  negative endocrine ROS  Renal/GU negative Renal ROS  negative genitourinary   Musculoskeletal negative musculoskeletal ROS (+)   Abdominal   Peds  Hematology negative hematology ROS (+)   Anesthesia Other Findings Day of surgery medications reviewed with patient.  Reproductive/Obstetrics negative OB ROS                            Anesthesia Physical Anesthesia Plan  ASA: 2  Anesthesia Plan: General   Post-op Pain Management: Tylenol PO (pre-op)*   Induction: Intravenous  PONV Risk Score and Plan: 4 or greater and Treatment may vary due to age or medical condition, Ondansetron, Dexamethasone, Midazolam, Scopolamine patch - Pre-op and Propofol infusion  Airway Management Planned: Oral ETT  Additional Equipment: None  Intra-op Plan:   Post-operative Plan: Extubation in OR  Informed Consent: I have reviewed the patients History and Physical, chart, labs and discussed the procedure including the risks, benefits and alternatives for the proposed anesthesia with the patient or authorized representative who has indicated his/her understanding and acceptance.     Dental advisory given  Plan Discussed with: CRNA  Anesthesia Plan Comments:         Anesthesia Quick Evaluation

## 2022-02-22 NOTE — Op Note (Signed)
Patient: Christine Stephens (May 04, 1995, [id number])  Date of Surgery: 02/22/22  Preoperative Diagnosis: APPENDICITIS   Postoperative Diagnosis: APPENDICITIS   Surgical Procedure: APPENDECTOMY LAPAROSCOPIC:    Operative Team Members:  Surgeon(s) and Role:    * Dandra Shambaugh, Hyman Hopes, MD - Primary   Anesthesiologist: Kaylyn Layer, MD CRNA: Vanessa Hickory, CRNA   Anesthesia: General   Fluids:  Total I/O In: 800 [I.V.:800] Out: 5 [Blood:5]  Complications: None  Drains:  none   Specimen:  ID Type Source Tests Collected by Time Destination  1 : Appendix Tissue PATH Appendix SURGICAL PATHOLOGY Benjamin Casanas, Hyman Hopes, MD 02/22/2022 [phone]      Disposition:  PACU - hemodynamically stable.  Plan of Care: Discharge to home after PACU    Indications for Procedure: Christine Stephens is a 26 y.o. female who presented with abdominal pain.  History, physical and imaging was concerning for appendicitis, so laparoscopic appendectomy was recommended for the patient.  The procedure itself, as well as the risks, benefits and alternatives were discussed with the patient.  Risks discussed included but were not limited to the risk of bleeding, infection, damage to nearby structures, need to convert to open procedure, incisional hernia, and the need for additional procedures or surgeries.  With this discussion complete and all questions answered the patient granted consent to proceed.  Findings: Inflamed appendix.  4CM simple ovarian cyst  Infection status: Patient: Christine Stephens Emergency General Surgery Service Patient Case: Urgent Infection Present At Time Of Surgery (PATOS):  inflammed appendix   Description of Procedure:   On the date stated above, the patient was taken to the operating room suite and placed in supine positioning with the left arm tucked.  Sequential compression devices were placed on the lower extremities to prevent blood clots.  General endotracheal anesthesia was  induced.  A foley catheter was placed and will be removed as soon as possible in the postoperative period.  Preoperative antibiotics were given.  The patient's abdomen was prepped and draped in the usual sterile fashion.  A time-out was completed verifying the correct patient, procedure, positioning and equipment needed for the case.  We began by anesthetizing the skin with local anesthetic and then making a 5 mm incision just below the umbilicus.  We dissected through the subcutaneous tissues to the fascia.  The fascia was grasped and elevated using a Kocher clamp.  A Veress needle was inserted into the abdomen and the abdomen was insufflated to 15 mmHg.  A 5 mm trocar was inserted in this position under optical guidance and then the abdomen was inspected.  There was no trauma to the underlying viscera with initial trocar placement.  Any abnormal findings, other than inflammation in the right lower quadrant, are listed above in the findings section.  Two additional trocars were placed, one 5 mm trocar suprapubically and one 12 mm trocar in the left lower quadrant.  These were placed under direct vision without any trauma to the underlying viscera.    The patient was then placed in head down, left side down positioning.  The appendix was identified and dissected free from its attachments to the abdominal wall, small intestine and cecum.  A window was created in the mesoappendix using blunt dissection.  A white load of the 45 mm linear stapler was used to divide the base the appendix off of the cecum.  The harmonic scalpel was used to divide the mesoappendix.  The appendix was removed through the 12 mm trocar site.  The patient had a 4 cm right-sided ovarian cyst.  It was present on preoperative imaging.  I asked for an intraoperative OB/GYN consultation and Dr. Delsa Sale was kind enough to step into the operating room and evaluate the cyst.  She explained it was best to not intervene at this time and  follow this as an outpatient.  It was clear her pain was from her appendicitis and not from the cyst.  The operative field was dry on final inspection. The staple line was well formed.  There was good hemostasis at the end of the case.  At this point we directed our attention to closure.  The patient was moved back to a level position.  The 12 mm trocar site was closed at the fascial level using an 0-vicryl  The abdomen was desufflated.  The skin was closed using 4-0 Monocryl and dermabond.  All sponge and needle counts were correct at the end of the case.    Louanna Raw, MD General, Bariatric, & Minimally Invasive Surgery [facility], [place]

## 2022-02-22 NOTE — Anesthesia Postprocedure Evaluation (Signed)
Anesthesia Post Note  Patient: Medical sales representative  Procedure(s) Performed: APPENDECTOMY LAPAROSCOPIC     Patient location during evaluation: PACU Anesthesia Type: General Level of consciousness: awake and alert Pain management: pain level controlled Vital Signs Assessment: post-procedure vital signs reviewed and stable Respiratory status: spontaneous breathing, nonlabored ventilation and respiratory function stable Cardiovascular status: blood pressure returned to baseline Postop Assessment: no apparent nausea or vomiting Anesthetic complications: no   No notable events documented.  Last Vitals:  Vitals:   02/22/22 1048 02/22/22 1100  BP:  109/64  Pulse: 78 78  Resp: 10 15  Temp:  (!) 36.3 C  SpO2: 94% 99%    Last Pain:  Vitals:   02/22/22 1100  TempSrc:   PainSc: [place]

## 2022-02-22 NOTE — H&P (Signed)
Admitting Physician: Nickola Major Raylie Maddison  Service: General Surgery  CC: Abdominal Pain  Subjective   HPI: Christine Stephens is an 26 y.o. female who is here for abdominal pain for a few days.  Located right lower quadrant.    Past Medical History:  Diagnosis Date   Anxiety    Asthma    Depression     Past Surgical History:  Procedure Laterality Date   TONSILLECTOMY      History reviewed. No pertinent family history.  Social:  reports that she has quit smoking. Her smoking use included cigarettes. She has never used smokeless tobacco. She reports current alcohol use. She reports that she does not use drugs.  Allergies: No Known Allergies  Medications: Current Outpatient Medications  Medication Instructions   albuterol (VENTOLIN HFA) 108 (90 Base) MCG/ACT inhaler 2 puffs, Inhalation, 4 times daily PRN   loratadine (CLARITIN) 10 mg, Oral, Daily   ondansetron (ZOFRAN ODT) 4 MG disintegrating tablet 4mg  ODT q6 hours prn nausea/vomit    ROS - all of the below systems have been reviewed with the patient and positives are indicated with bold text General: chills, fever or night sweats Eyes: blurry vision or double vision ENT: epistaxis or sore throat Allergy/Immunology: itchy/watery eyes or nasal congestion Hematologic/Lymphatic: bleeding problems, blood clots or swollen lymph nodes Endocrine: temperature intolerance or unexpected weight changes Breast: new or changing breast lumps or nipple discharge Resp: cough, shortness of breath, or wheezing CV: chest pain or dyspnea on exertion GI: as per HPI GU: dysuria, trouble voiding, or hematuria MSK: joint pain or joint stiffness Neuro: TIA or stroke symptoms Derm: pruritus and skin lesion changes Psych: anxiety and depression  Objective   PE Blood pressure 114/80, pulse 70, temperature 98 F (36.7 C), temperature source Oral, resp. rate 16, height 5' 6.5" (1.689 m), weight 61.7 kg, last menstrual period 02/21/2022, SpO2  100 %. Constitutional: NAD; conversant; no deformities Eyes: Moist conjunctiva; no lid lag; anicteric; PERRL Neck: Trachea midline; no thyromegaly Lungs: Normal respiratory effort; no tactile fremitus CV: RRR; no palpable thrills; no pitting edema GI: Abd Right lower quadrant abdominal tenderness; no palpable hepatosplenomegaly MSK: Normal range of motion of extremities; no clubbing/cyanosis Psychiatric: Appropriate affect; alert and oriented x3 Lymphatic: No palpable cervical or axillary lymphadenopathy  Results for orders placed or performed during the hospital encounter of 02/21/22 (from the past 24 hour(s))  CBC with Differential     Status: Abnormal   Collection Time: 02/21/22  6:02 PM  Result Value Ref Range   WBC 7.3 4.0 - 10.5 K/uL   RBC 4.93 3.87 - 5.11 MIL/uL   Hemoglobin 15.1 (H) 12.0 - 15.0 g/dL   HCT 43.3 36.0 - 46.0 %   MCV 87.8 80.0 - 100.0 fL   MCH 30.6 26.0 - 34.0 pg   MCHC 34.9 30.0 - 36.0 g/dL   RDW 12.2 11.5 - 15.5 %   Platelets 342 150 - 400 K/uL   nRBC 0.0 0.0 - 0.2 %   Neutrophils Relative % 57 %   Neutro Abs 4.2 1.7 - 7.7 K/uL   Lymphocytes Relative 32 %   Lymphs Abs 2.3 0.7 - 4.0 K/uL   Monocytes Relative 6 %   Monocytes Absolute 0.5 0.1 - 1.0 K/uL   Eosinophils Relative 4 %   Eosinophils Absolute 0.3 0.0 - 0.5 K/uL   Basophils Relative 1 %   Basophils Absolute 0.1 0.0 - 0.1 K/uL   Immature Granulocytes 0 %   Abs Immature Granulocytes  0.02 0.00 - 0.07 K/uL  Comprehensive metabolic panel     Status: None   Collection Time: 02/21/22  6:02 PM  Result Value Ref Range   Sodium 136 135 - 145 mmol/L   Potassium 3.5 3.5 - 5.1 mmol/L   Chloride 106 98 - 111 mmol/L   CO2 23 22 - 32 mmol/L   Glucose, Bld 94 70 - 99 mg/dL   BUN 10 6 - 20 mg/dL   Creatinine, Ser 0.62 0.44 - 1.00 mg/dL   Calcium 9.2 8.9 - 69.4 mg/dL   Total Protein 7.4 6.5 - 8.1 g/dL   Albumin 4.3 3.5 - 5.0 g/dL   AST 18 15 - 41 U/L   ALT 16 0 - 44 U/L   Alkaline Phosphatase 48 38 - 126  U/L   Total Bilirubin 0.6 0.3 - 1.2 mg/dL   GFR, Estimated >85 >46 mL/min   Anion gap 7 5 - 15  Lipase, blood     Status: None   Collection Time: 02/21/22  6:02 PM  Result Value Ref Range   Lipase 34 11 - 51 U/L  I-Stat Beta hCG blood, ED ([facility], [facility], [facility] only)     Status: None   Collection Time: 02/21/22  6:12 PM  Result Value Ref Range   I-stat hCG, quantitative <5.0 <5 mIU/mL   Comment 3          CBC     Status: None   Collection Time: 02/22/22  4:47 AM  Result Value Ref Range   WBC 5.9 4.0 - 10.5 K/uL   RBC 4.22 3.87 - 5.11 MIL/uL   Hemoglobin 12.8 12.0 - 15.0 g/dL   HCT 27.0 35.0 - 09.3 %   MCV 88.9 80.0 - 100.0 fL   MCH 30.3 26.0 - 34.0 pg   MCHC 34.1 30.0 - 36.0 g/dL   RDW 81.8 29.9 - 37.1 %   Platelets 292 150 - 400 K/uL   nRBC 0.0 0.0 - 0.2 %  Creatinine, serum     Status: None   Collection Time: 02/22/22  4:47 AM  Result Value Ref Range   Creatinine, Ser 0.55 0.44 - 1.00 mg/dL   GFR, Estimated >69 >67 mL/min     Imaging Orders         CT Abdomen Pelvis W Contrast    - Reviewed.  Appendicitis and ovarian cyst  Assessment and Plan   Christine Stephens is an 26 y.o. female with appendicitis.  I recommended laparoscopic appendectomy.  The procedure, its risks, benefits and alternatives were discussed and the patient granted consent to proceed.  We will proceed as scheduled.    ICD-10-CM   1. Acute appendicitis with localized peritonitis without perforation, unspecified whether abscess present, unspecified whether gangrene present  K35.30        Quentin Ore, MD  [facility] Use [email or web address] to contact on call provider  New Patient Billing: [id number] - High MDM

## 2022-02-23 LAB — SURGICAL PATHOLOGY

## 2022-02-28 ENCOUNTER — Encounter (HOSPITAL_COMMUNITY): Payer: Self-pay | Admitting: Surgery
